# Patient Record
Sex: Female | Born: 1965 | Race: White | Hispanic: No | Marital: Married | Smoking: Former smoker
Health system: Southern US, Community
[De-identification: ages and names within clinical notes are randomized; demographics above are authoritative.]

## PROBLEM LIST (undated history)

## (undated) DIAGNOSIS — C4362 Malignant melanoma of left upper limb, including shoulder: Secondary | ICD-10-CM

## (undated) DIAGNOSIS — I499 Cardiac arrhythmia, unspecified: Secondary | ICD-10-CM

## (undated) DIAGNOSIS — F419 Anxiety disorder, unspecified: Secondary | ICD-10-CM

## (undated) DIAGNOSIS — K5790 Diverticulosis of intestine, part unspecified, without perforation or abscess without bleeding: Secondary | ICD-10-CM

## (undated) DIAGNOSIS — E785 Hyperlipidemia, unspecified: Secondary | ICD-10-CM

## (undated) DIAGNOSIS — R319 Hematuria, unspecified: Secondary | ICD-10-CM

## (undated) DIAGNOSIS — K635 Polyp of colon: Secondary | ICD-10-CM

## (undated) DIAGNOSIS — K219 Gastro-esophageal reflux disease without esophagitis: Secondary | ICD-10-CM

## (undated) HISTORY — DX: Cardiac arrhythmia, unspecified: I49.9

## (undated) HISTORY — DX: Polyp of colon: K63.5

## (undated) HISTORY — DX: Malignant melanoma of left upper limb, including shoulder: C43.62

## (undated) HISTORY — DX: Hematuria, unspecified: R31.9

## (undated) HISTORY — PX: OTHER SURGICAL HISTORY: SHX169

## (undated) HISTORY — DX: Diverticulosis of intestine, part unspecified, without perforation or abscess without bleeding: K57.90

## (undated) HISTORY — DX: Anxiety disorder, unspecified: F41.9

## (undated) HISTORY — DX: Gastro-esophageal reflux disease without esophagitis: K21.9

## (undated) HISTORY — DX: Hyperlipidemia, unspecified: E78.5

---

## 1997-08-24 ENCOUNTER — Inpatient Hospital Stay (HOSPITAL_COMMUNITY): Admission: AD | Admit: 1997-08-24 | Discharge: 1997-08-24 | Payer: Self-pay | Admitting: *Deleted

## 1997-10-03 ENCOUNTER — Inpatient Hospital Stay (HOSPITAL_COMMUNITY): Admission: AD | Admit: 1997-10-03 | Discharge: 1997-10-07 | Payer: Self-pay | Admitting: Obstetrics and Gynecology

## 1997-10-09 ENCOUNTER — Encounter (HOSPITAL_COMMUNITY): Admission: RE | Admit: 1997-10-09 | Discharge: 1997-11-30 | Payer: Self-pay | Admitting: Obstetrics and Gynecology

## 1998-12-18 ENCOUNTER — Other Ambulatory Visit: Admission: RE | Admit: 1998-12-18 | Discharge: 1998-12-18 | Payer: Self-pay | Admitting: *Deleted

## 1999-12-21 ENCOUNTER — Other Ambulatory Visit: Admission: RE | Admit: 1999-12-21 | Discharge: 1999-12-21 | Payer: Self-pay | Admitting: *Deleted

## 2001-01-26 ENCOUNTER — Other Ambulatory Visit: Admission: RE | Admit: 2001-01-26 | Discharge: 2001-01-26 | Payer: Self-pay | Admitting: Obstetrics and Gynecology

## 2002-02-23 ENCOUNTER — Other Ambulatory Visit: Admission: RE | Admit: 2002-02-23 | Discharge: 2002-02-23 | Payer: Self-pay | Admitting: *Deleted

## 2003-05-13 ENCOUNTER — Other Ambulatory Visit: Admission: RE | Admit: 2003-05-13 | Discharge: 2003-05-13 | Payer: Self-pay | Admitting: *Deleted

## 2004-05-18 ENCOUNTER — Other Ambulatory Visit: Admission: RE | Admit: 2004-05-18 | Discharge: 2004-05-18 | Payer: Self-pay | Admitting: Obstetrics and Gynecology

## 2004-07-06 ENCOUNTER — Ambulatory Visit: Payer: Self-pay | Admitting: Cardiology

## 2004-07-27 ENCOUNTER — Ambulatory Visit: Payer: Self-pay

## 2007-01-15 DIAGNOSIS — K635 Polyp of colon: Secondary | ICD-10-CM

## 2007-01-15 HISTORY — DX: Polyp of colon: K63.5

## 2007-12-21 ENCOUNTER — Emergency Department (HOSPITAL_COMMUNITY): Admission: EM | Admit: 2007-12-21 | Discharge: 2007-12-21 | Payer: Self-pay | Admitting: Emergency Medicine

## 2010-03-16 ENCOUNTER — Other Ambulatory Visit: Payer: Self-pay | Admitting: Family Medicine

## 2010-03-16 ENCOUNTER — Ambulatory Visit
Admission: RE | Admit: 2010-03-16 | Discharge: 2010-03-16 | Disposition: A | Discharge status: Home / Self Care | Payer: BC Managed Care – PPO | Source: Ambulatory Visit | Attending: Family Medicine | Admitting: Family Medicine

## 2010-03-16 MED ORDER — IOHEXOL 300 MG/ML  SOLN
100.0000 mL | Freq: Once | INTRAMUSCULAR | Status: AC | PRN
  Administered 2010-03-16: 100 mL via INTRAVENOUS

## 2010-10-19 LAB — POCT RAPID STREP A: Streptococcus, Group A Screen (Direct): NEGATIVE

## 2011-04-25 ENCOUNTER — Ambulatory Visit
Admission: RE | Admit: 2011-04-25 | Discharge: 2011-04-25 | Disposition: A | Discharge status: Home / Self Care | Payer: BC Managed Care – PPO | Source: Ambulatory Visit | Attending: Family Medicine | Admitting: Family Medicine

## 2011-04-25 ENCOUNTER — Other Ambulatory Visit: Payer: Self-pay | Admitting: Family Medicine

## 2011-04-25 MED ORDER — IOHEXOL 300 MG/ML  SOLN
100.0000 mL | Freq: Once | INTRAMUSCULAR | Status: AC | PRN
  Administered 2011-04-25: 100 mL via INTRAVENOUS

## 2011-04-25 MED ORDER — IOHEXOL 300 MG/ML  SOLN
30.0000 mL | INTRAMUSCULAR | Status: AC
  Administered 2011-04-25: 30 mL via ORAL

## 2011-06-03 ENCOUNTER — Other Ambulatory Visit (INDEPENDENT_AMBULATORY_CARE_PROVIDER_SITE_OTHER): Payer: Self-pay | Admitting: Surgery

## 2011-06-03 ENCOUNTER — Encounter (INDEPENDENT_AMBULATORY_CARE_PROVIDER_SITE_OTHER): Payer: Self-pay | Admitting: Surgery

## 2011-06-03 ENCOUNTER — Ambulatory Visit (INDEPENDENT_AMBULATORY_CARE_PROVIDER_SITE_OTHER): Payer: BC Managed Care – PPO | Admitting: Surgery

## 2011-06-03 VITALS — BP 122/84 | HR 80 | Temp 98.3°F | Resp 14 | Ht 66.0 in | Wt 123.0 lb

## 2011-06-03 DIAGNOSIS — K635 Polyp of colon: Secondary | ICD-10-CM | POA: Insufficient documentation

## 2011-06-03 DIAGNOSIS — R319 Hematuria, unspecified: Secondary | ICD-10-CM | POA: Insufficient documentation

## 2011-06-03 DIAGNOSIS — K5732 Diverticulitis of large intestine without perforation or abscess without bleeding: Secondary | ICD-10-CM

## 2011-06-03 DIAGNOSIS — D126 Benign neoplasm of colon, unspecified: Secondary | ICD-10-CM

## 2011-06-03 DIAGNOSIS — K5792 Diverticulitis of intestine, part unspecified, without perforation or abscess without bleeding: Secondary | ICD-10-CM

## 2011-06-03 NOTE — Patient Instructions (Signed)
Diverticulitis A diverticulum is a small pouch or sac on the colon. Diverticulosis is the presence of these diverticula on the colon. Diverticulitis is the irritation (inflammation) or infection of diverticula. CAUSES  The colon and its diverticula contain bacteria. If food particles block the tiny opening to a diverticulum, the bacteria inside can grow and cause an increase in pressure. This leads to infection and inflammation and is called diverticulitis. SYMPTOMS   Abdominal pain and tenderness. Usually, the pain is located on the left side of your abdomen. However, it could be located elsewhere.   Fever.   Bloating.   Feeling sick to your stomach (nausea).   Throwing up (vomiting).   Abnormal stools.  DIAGNOSIS  Your caregiver will take a history and perform a physical exam. Since many things can cause abdominal pain, other tests may be necessary. Tests may include:  Blood tests.   Urine tests.   X-ray of the abdomen.   CT scan of the abdomen.  Sometimes, surgery is needed to determine if diverticulitis or other conditions are causing your symptoms. TREATMENT  Most of the time, you can be treated without surgery. Treatment includes:  Resting the bowels by only having liquids for a few days. As you improve, you will need to eat a low-fiber diet.   Intravenous (IV) fluids if you are losing body fluids (dehydrated).   Antibiotic medicines that treat infections may be given.   Pain and nausea medicine, if needed.   Surgery if the inflamed diverticulum has burst.  HOME CARE INSTRUCTIONS   Try a clear liquid diet (broth, tea, or water for as long as directed by your caregiver). You may then gradually begin a low-fiber diet as tolerated. A low-fiber diet is a diet with less than 10 grams of fiber. Choose the foods below to reduce fiber in the diet:   White breads, cereals, rice, and pasta.   Cooked fruits and vegetables or soft fresh fruits and vegetables without the skin.     Ground or well-cooked tender beef, ham, veal, lamb, pork, or poultry.   Eggs and seafood.   After your diverticulitis symptoms have improved, your caregiver may put you on a high-fiber diet. A high-fiber diet includes 14 grams of fiber for every 1000 calories consumed. For a standard 2000 calorie diet, you would need 28 grams of fiber. Follow these diet guidelines to help you increase the fiber in your diet. It is important to slowly increase the amount fiber in your diet to avoid gas, constipation, and bloating.   Choose whole-grain breads, cereals, pasta, and brown rice.   Choose fresh fruits and vegetables with the skin on. Do not overcook vegetables because the more vegetables are cooked, the more fiber is lost.   Choose more nuts, seeds, legumes, dried peas, beans, and lentils.   Look for food products that have greater than 3 grams of fiber per serving on the Nutrition Facts label.   Take all medicine as directed by your caregiver.   If your caregiver has given you a follow-up appointment, it is very important that you go. Not going could result in lasting (chronic) or permanent injury, pain, and disability. If there is any problem keeping the appointment, call to reschedule.  SEEK MEDICAL CARE IF:   Your pain does not improve.   You have a hard time advancing your diet beyond clear liquids.   Your bowel movements do not return to normal.  SEEK IMMEDIATE MEDICAL CARE IF:   Your pain becomes   worse.   You have an oral temperature above 102 F (38.9 C), not controlled by medicine.   You have repeated vomiting.   You have bloody or black, tarry stools.   Symptoms that brought you to your caregiver become worse or are not getting better.  MAKE SURE YOU:   Understand these instructions.   Will watch your condition.   Will get help right away if you are not doing well or get worse.  Document Released: 10/10/2004 Document Revised: 12/20/2010 Document Reviewed:  02/05/2010 Exit[employer] Patient Information 2012 [employer]. 

## 2011-06-03 NOTE — Progress Notes (Signed)
Subjective:     Patient ID: Cheryl Fisher, female   DOB: 07-19-1965, 46 y.o.   MRN: [id number]  HPI  Cheryl Fisher  Oct 13, 1965 [id number]  Patient Care Team: Sigmund Hazel, MD as PCP - General (Family Medicine)  This patient is a 46 y.o.female who presents today for surgical evaluation at the request of Dr. Hyacinth Meeker.   Reason for visit: Recurrent diverticulitis. Consideration of colectomy.  Patient is a pleasant active female. For the past 4 years and she's had at least 2 attacks a year. CT has documented diverticulitis of the descending colon and sigmoid colon. She describes them as crampy bloating pain. A field block up. Usually in the left lower quadrant and left side. We'll get some fecal urgency. No real relief. Milder attacks usually been helped by Aleve. She had a more 10 contents attack. Augmentin did not help. Eventually Cipro and Flagyl did.  She has a history of polyps in 2009 by endoscopy by Dr. Danise Edge. 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  She notes her stools have been more ribbonlike and more narrow in caliber. This concerned her.  No major bleeding.  Ago she's had at least 8 attacks in the most recent was the most severe, she was sent to me for consideration of surgery  Patient Active Problem List  Diagnoses  . Diverticulitis, recurrent  . Colon polyp, hamartomatous polyp     Past Medical History  Diagnosis Date  . Hyperlipidemia   . 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 Malignant melanoma of skin of forearm, left     excised.  Neg SLN  . Hematuria - cause not known     History reviewed. No pertinent past surgical history.  History   Social History  . Marital Status: Married    Spouse Name: N/A    Number of Children: N/A  . Years of Education: N/A   Occupational History  . Not on file.   Social History Main Topics  . Smoking status: Former Smoker    Quit date: 01/14/1990  . Smokeless tobacco: Not on file    . Alcohol Use: Yes     2 per week  . Drug Use: No  . Sexually Active:    Other Topics Concern  . Not on file   Social History Narrative  . No narrative on file    History reviewed. No pertinent family history.  Current Outpatient Prescriptions  Medication Sig Dispense Refill  . ALPRAZolam (XANAX) 0.5 MG tablet       . diphenhydrAMINE (SOMINEX) 25 MG tablet Take 25 mg by mouth at bedtime as needed.      Marland Kitchen ERRIN 0.35 MG tablet       . OMEGA 3 1200 MG CAPS Take by mouth.      . Probiotic Product (PROBIOTIC PO) Take by mouth.         Allergies  Allergen Reactions  . Neomycin     Contact Dermititis  . Sulfa Antibiotics Hives  . Tape     Contact Dermititis    BP 122/84  Pulse 80  Temp(Src) 98.3 F (36.8 C) (Temporal)  Resp 14  Ht 5\' 6"  (1.676 m)  Wt 123 lb (55.792 kg)  BMI 19.85 kg/m2  No results found.   Review of Systems  Constitutional: Negative for fever, chills, diaphoresis, appetite change and fatigue.  HENT: Negative for ear pain, sore throat, trouble swallowing, neck pain and ear discharge.  Eyes: Negative for photophobia, discharge and visual disturbance.  Respiratory: Negative for cough, choking, chest tightness and shortness of breath.   Cardiovascular: Negative for chest pain.       Patient walks for about 5 miles without difficulty.  No exertional chest/neck/shoulder/arm pain.   Gastrointestinal: Negative for nausea, vomiting, constipation, blood in stool, anal bleeding and rectal pain.  Genitourinary: Negative for dysuria, frequency and difficulty urinating.  Musculoskeletal: Negative for myalgias and gait problem.  Skin: Negative for color change, pallor and rash.  Neurological: Negative for dizziness, speech difficulty, weakness and numbness.  Hematological: Negative for adenopathy.  Psychiatric/Behavioral: Negative for confusion and agitation. The patient is not nervous/anxious.        Objective:   Physical Exam  Constitutional: She is  oriented to person, place, and time. She appears well-developed and well-nourished. No distress.  HENT:  Head: Normocephalic.  Mouth/Throat: Oropharynx is clear and moist. No oropharyngeal exudate.  Eyes: Conjunctivae and EOM are normal. Pupils are equal, round, and reactive to light. No scleral icterus.  Neck: Normal range of motion. Neck supple. No tracheal deviation present.  Cardiovascular: Normal rate, regular rhythm and intact distal pulses.   Pulmonary/Chest: Effort normal and breath sounds normal. No respiratory distress. She exhibits no tenderness.  Abdominal: Soft. She exhibits no distension and no mass. There is no tenderness. Hernia confirmed negative in the right inguinal area and confirmed negative in the left inguinal area.  Genitourinary: No vaginal discharge found.  Musculoskeletal: Normal range of motion. She exhibits no tenderness.  Lymphadenopathy:    She has no cervical adenopathy.       Right: No inguinal adenopathy present.       Left: No inguinal adenopathy present.  Neurological: She is alert and oriented to person, place, and time. No cranial nerve deficit. She exhibits normal muscle tone. Coordination normal.  Skin: Skin is warm and dry. No rash noted. She is not diaphoretic. No erythema.  Psychiatric: She has a normal mood and affect. Her behavior is normal. Judgment and thought content normal.       Assessment:     Recurrent diverticulitis descending & sigmoid    Plan:     Given her young age and frequent attacks, I think she would benefit from resection. She probably will require more extended left hemicolectomy given the descending colon involved as well.   She is a good laparoscopic candidate  Change in caliber or some stools warrants a preop endoscopy. She would like a different gastroenterologist if possible since she feels she did not hit it off as well as the prior one.  She has had trouble trying to establish with a new one. We will see if we can  facilitate this.  The anatomy & physiology of the digestive tract was discussed.  The pathophysiology was discussed.  Natural history risks without surgery was discussed.   I feel the risks of no intervention will lead to serious problems that outweigh the operative risks; therefore, I recommended a partial colectomy to remove the pathology.  Laparoscopic & open techniques were discussed.   Risks such as bleeding, infection, abscess, leak, reoperation, possible ostomy, hernia, heart attack, death, and other risks were discussed.  I noted a good likelihood this will help address the problem.   Goals of post-operative recovery were discussed as well.  We will work to minimize complications.  An educational handout on the pathology was given as well.  Questions were answered.  The patient expresses understanding & wishes to  proceed with surgery.

## 2011-06-06 ENCOUNTER — Encounter: Payer: Self-pay | Admitting: Gastroenterology

## 2011-07-05 ENCOUNTER — Encounter: Payer: Self-pay | Admitting: Gastroenterology

## 2011-07-05 ENCOUNTER — Ambulatory Visit (INDEPENDENT_AMBULATORY_CARE_PROVIDER_SITE_OTHER): Payer: BC Managed Care – PPO | Admitting: Gastroenterology

## 2011-07-05 VITALS — BP 106/70 | HR 80 | Ht 66.0 in | Wt 124.8 lb

## 2011-07-05 DIAGNOSIS — K5732 Diverticulitis of large intestine without perforation or abscess without bleeding: Secondary | ICD-10-CM

## 2011-07-05 DIAGNOSIS — R933 Abnormal findings on diagnostic imaging of other parts of digestive tract: Secondary | ICD-10-CM

## 2011-07-05 DIAGNOSIS — R198 Other specified symptoms and signs involving the digestive system and abdomen: Secondary | ICD-10-CM

## 2011-07-05 MED ORDER — MOVIPREP 100 G PO SOLR: 1.0000 | Freq: Once | ORAL | Status: DC

## 2011-07-05 NOTE — Patient Instructions (Addendum)
You have been scheduled for a colonoscopy with propofol. Please follow written instructions given to you at your visit today.  Please pick up your prep kit at the pharmacy within the next 1-3 days. cc: Sigmund Hazel, MD       Karie Soda, MD

## 2011-07-05 NOTE — Progress Notes (Signed)
History of Present Illness: This is a 46 year old female with a history of recurrent diverticulitis. She states she has had 8-10 episodes of diverticulitis since 2009. Her most recent episode was more severe. CT scan performed in April 2013 showed diverticulitis in the sigmoid colon and descending colon. She states she was treated with 3 courses of antibiotics for her most recent episode of diverticulitis. She is noted a change in bowel habits with longer, thinner stools since her last bout of diverticulitis. She also lost weight with her episode of diverticulitis. She previously underwent colonoscopy by Dr. Reece Agar in 2009 with findings of diverticulosis and a hamartomatous colon polyp by report. I do not have the procedure report available to review today. She relates problems with nausea and vomiting following her last colonoscopy. Denies constipation, diarrhea, change in stool caliber, melena, hematochezia, nausea, vomiting, dysphagia, reflux symptoms, chest pain.  Review of Systems: Pertinent positive and negative review of systems were noted in the above HPI section. All other review of systems were otherwise negative.  Current Medications, Allergies, Past Medical History, Past Surgical History, Family History and Social History were reviewed in [employer] record.  Physical Exam: General: Well developed , well nourished, no acute distress Head: Normocephalic and atraumatic Eyes:  sclerae anicteric, EOMI Ears: Normal auditory acuity Mouth: No deformity or lesions Neck: Supple, no masses or thyromegaly Lungs: Clear throughout to auscultation Heart: Regular rate and rhythm; no murmurs, rubs or bruits Abdomen: Soft, non tender and non distended. No masses, hepatosplenomegaly or hernias noted. Normal Bowel sounds Rectal: deferred to colonoscopy Musculoskeletal: Symmetrical with no gross deformities  Skin: No lesions on visible extremities Pulses:  Normal pulses  noted Extremities: No clubbing, cyanosis, edema or deformities noted Neurological: Alert oriented x 4, grossly nonfocal Cervical Nodes:  No significant cervical adenopathy Inguinal Nodes: No significant inguinal adenopathy Psychological:  Alert and cooperative. Normal mood and affect  Assessment and Recommendations:  1. Recurrent diverticulitis. Change in bowel habits. Rule out a stricture, neoplasm, inflammatory bowel disease and other disorders. Schedule colonoscopy. The risks, benefits, and alternatives to colonoscopy with possible biopsy and possible polypectomy were discussed with the patient and they consent to proceed. She relates nausea and vomiting following her last colonoscopy we will consider peri-procedure antiemetics. Await records from prior colonoscopy. Long-term high fiber diet with adequate daily water intake.

## 2011-07-12 ENCOUNTER — Ambulatory Visit (AMBULATORY_SURGERY_CENTER): Payer: BC Managed Care – PPO | Admitting: Gastroenterology

## 2011-07-12 ENCOUNTER — Encounter: Payer: Self-pay | Admitting: Gastroenterology

## 2011-07-12 VITALS — BP 132/75 | HR 72 | Temp 98.9°F | Resp 16 | Ht 66.0 in | Wt 124.0 lb

## 2011-07-12 DIAGNOSIS — R933 Abnormal findings on diagnostic imaging of other parts of digestive tract: Secondary | ICD-10-CM

## 2011-07-12 DIAGNOSIS — K5732 Diverticulitis of large intestine without perforation or abscess without bleeding: Secondary | ICD-10-CM

## 2011-07-12 MED ORDER — SODIUM CHLORIDE 0.9 % IV SOLN: 500.0000 mL | INTRAVENOUS | Status: DC

## 2011-07-12 NOTE — Progress Notes (Signed)
Patient did not experience any of the following events: a burn prior to discharge; a fall within the facility; wrong site/side/patient/procedure/implant event; or a hospital transfer or hospital admission upon discharge from the facility. (G8907) Patient did not have preoperative order for IV antibiotic SSI prophylaxis. (G8918)  

## 2011-07-12 NOTE — Op Note (Signed)
[facility] [address] [place], [place]  [address]  COLONOSCOPY PROCEDURE REPORT  PATIENT:  Cheryl Fisher, Cheryl Fisher  MR#:  [id number] BIRTHDATE:  [birth year], 45 yrs. old  GENDER:  female ENDOSCOPIST:  Judie Petit T. Russella Dar, MD, [facility] Referred by:  Sigmund Hazel, M.D. PROCEDURE DATE:  07/12/2011 PROCEDURE:  Colonoscopy 13086 ASA CLASS:  Class II INDICATIONS:  1) Abnormal CT of abdomen  2) diverticulitis MEDICATIONS:   MAC sedation, administered by CRNA, propofol (Diprivan) 370 mg IV DESCRIPTION OF PROCEDURE:   After the risks benefits and alternatives of the procedure were thoroughly explained, informed consent was obtained.  Digital rectal exam was performed and revealed no abnormalities.   The LB [id number] endoscope was introduced through the anus and advanced to the cecum, which was identified by both the appendix and ileocecal valve, without limitations.  The quality of the prep was excellent, using MoviPrep.  The instrument was then slowly withdrawn as the colon was fully examined. <<PROCEDUREIMAGES>> FINDINGS:  Moderate diverticulosis was found in the sigmoid to descending colon.  Otherwise normal colonoscopy without other polyps, masses, vascular ectasias, or inflammatory changes. Retroflexed views in the rectum revealed no abnormalities.  The time to cecum =  3.75  minutes. The scope was then withdrawn (time =  10.25  min) from the patient and the procedure completed.  COMPLICATIONS:  None  ENDOSCOPIC IMPRESSION: 1) Moderate diverticulosis in the sigmoid to descending colon  RECOMMENDATIONS: 1) High fiber diet with liberal fluid intake. 2) Continue current colorectal screening for "routine risk" patients with a repeat colonoscopy in 10 years.  Venita Lick. Russella Dar, MD, [facility]  n. eSIGNED:   Venita Lick. Keshawn Fiorito at 07/12/2011 10:07 AM  Rebeca Allegra, [phone]

## 2011-07-12 NOTE — Patient Instructions (Addendum)
Discharge instructions given with verbal understanding. Handouts on diverticulosis and a high fiber diet. Resume previous medications. YOU HAD AN ENDOSCOPIC PROCEDURE TODAY AT THE [facility]: Refer to the procedure report that was given to you for any specific questions about what was found during the examination.  If the procedure report does not answer your questions, please call your gastroenterologist to clarify.  If you requested that your care partner not be given the details of your procedure findings, then the procedure report has been included in a sealed envelope for you to review at your convenience later.  YOU SHOULD EXPECT: Some feelings of bloating in the abdomen. Passage of more gas than usual.  Walking can help get rid of the air that was put into your GI tract during the procedure and reduce the bloating. If you had a lower endoscopy (such as a colonoscopy or flexible sigmoidoscopy) you may notice spotting of blood in your stool or on the toilet paper. If you underwent a bowel prep for your procedure, then you may not have a normal bowel movement for a few days.  DIET: Your first meal following the procedure should be a light meal and then it is ok to progress to your normal diet.  A half-sandwich or bowl of soup is an example of a good first meal.  Heavy or fried foods are harder to digest and may make you feel nauseous or bloated.  Likewise meals heavy in dairy and vegetables can cause extra gas to form and this can also increase the bloating.  Drink plenty of fluids but you should avoid alcoholic beverages for 24 hours.  ACTIVITY: Your care partner should take you home directly after the procedure.  You should plan to take it easy, moving slowly for the rest of the day.  You can resume normal activity the day after the procedure however you should NOT DRIVE or use heavy machinery for 24 hours (because of the sedation medicines used during the test).    SYMPTOMS TO REPORT  IMMEDIATELY: A gastroenterologist can be reached at any hour.  During normal business hours, 8:30 AM to 5:00 PM Monday through Friday, call [phone].  After hours and on weekends, please call the GI answering service at [phone] who will take a message and have the physician on call contact you.   Following lower endoscopy (colonoscopy or flexible sigmoidoscopy):  Excessive amounts of blood in the stool  Significant tenderness or worsening of abdominal pains  Swelling of the abdomen that is new, acute  Fever of 100F or higher FOLLOW UP: If any biopsies were taken you will be contacted by phone or by letter within the next 1-3 weeks.  Call your gastroenterologist if you have not heard about the biopsies in 3 weeks.  Our staff will call the home number listed on your records the next business day following your procedure to check on you and address any questions or concerns that you may have at that time regarding the information given to you following your procedure. This is a courtesy call and so if there is no answer at the home number and we have not heard from you through the emergency physician on call, we will assume that you have returned to your regular daily activities without incident.  SIGNATURES/CONFIDENTIALITY: You and/or your care partner have signed paperwork which will be entered into your electronic medical record.  These signatures attest to the fact that that the information above on your After   Visit Summary has been reviewed and is understood.  Full responsibility of the confidentiality of this discharge information lies with you and/or your care-partner. 

## 2011-07-15 ENCOUNTER — Telehealth: Payer: Self-pay

## 2011-07-15 NOTE — Telephone Encounter (Signed)
  Follow up Call-  Call back number 07/12/2011  Post procedure Call Back phone  # [phone]  Permission to leave phone message Yes     Patient questions:  Do you have a fever, pain , or abdominal swelling? no Pain Score  0 *  Have you tolerated food without any problems? yes  Have you been able to return to your normal activities? yes  Do you have any questions about your discharge instructions: Diet   no Medications  no Follow up visit  no  Do you have questions or concerns about your Care? no  Actions: * If pain score is 4 or above: No action needed, pain <4.

## 2011-08-20 ENCOUNTER — Ambulatory Visit: Payer: BC Managed Care – PPO | Admitting: Gastroenterology

## 2011-08-23 ENCOUNTER — Ambulatory Visit (HOSPITAL_COMMUNITY): Admission: RE | Admit: 2011-08-23 | Payer: BC Managed Care – PPO | Source: Ambulatory Visit | Admitting: Surgery

## 2011-08-23 ENCOUNTER — Encounter (HOSPITAL_COMMUNITY): Admission: RE | Payer: Self-pay | Source: Ambulatory Visit

## 2011-08-23 SURGERY — LAPAROSCOPIC PARTIAL COLECTOMY
Anesthesia: General

## 2011-09-09 ENCOUNTER — Encounter: Payer: Self-pay | Admitting: Gastroenterology

## 2011-09-09 ENCOUNTER — Ambulatory Visit (INDEPENDENT_AMBULATORY_CARE_PROVIDER_SITE_OTHER): Payer: BC Managed Care – PPO | Admitting: Gastroenterology

## 2011-09-09 VITALS — BP 108/70 | HR 60 | Ht 66.0 in | Wt 124.8 lb

## 2011-09-09 DIAGNOSIS — K5730 Diverticulosis of large intestine without perforation or abscess without bleeding: Secondary | ICD-10-CM

## 2011-09-09 NOTE — Patient Instructions (Addendum)
Continue your High Fiber diet.  High Fiber Diet A high fiber diet changes your normal diet to include more whole grains, legumes, fruits, and vegetables. Changes in the diet involve replacing refined carbohydrates with unrefined foods. The calorie level of the diet is essentially unchanged. The Dietary Reference Intake (recommended amount) for adult males is 38 g per day. For adult females, it is 25 g per day. Pregnant and lactating women should consume 28 g of fiber per day. Fiber is the intact part of a plant that is not broken down during digestion. Functional fiber is fiber that has been isolated from the plant to provide a beneficial effect in the body. PURPOSE  Increase stool bulk.   Ease and regulate bowel movements.   Lower cholesterol.  INDICATIONS THAT YOU NEED MORE FIBER  Constipation and hemorrhoids.   Uncomplicated diverticulosis (intestine condition) and irritable bowel syndrome.   Weight management.   As a protective measure against hardening of the arteries (atherosclerosis), diabetes, and cancer.  NOTE OF CAUTION If you have a digestive or bowel problem, ask your caregiver for advice before adding high fiber foods to your diet. Some of the following medical problems are such that a high fiber diet should not be used without consulting your caregiver:  Acute diverticulitis (intestine infection).   Partial small bowel obstructions.   Complicated diverticular disease involving bleeding, rupture (perforation), or abscess (boil, furuncle).   Presence of autonomic neuropathy (nerve damage) or gastric paresis (stomach cannot empty itself).  GUIDELINES FOR INCREASING FIBER  Start adding fiber to the diet slowly. A gradual increase of about 5 more grams (2 slices of whole-wheat bread, 2 servings of most fruits or vegetables, or 1 bowl of high fiber cereal) per day is best. Too rapid an increase in fiber may result in constipation, flatulence, and bloating.   Drink enough  water and fluids to keep your urine clear or pale yellow. Water, juice, or caffeine-free drinks are recommended. Not drinking enough fluid may cause constipation.   Eat a variety of high fiber foods rather than one type of fiber.   Try to increase your intake of fiber through using high fiber foods rather than fiber pills or supplements that contain small amounts of fiber.   The goal is to change the types of food eaten. Do not supplement your present diet with high fiber foods, but replace foods in your present diet.  INCLUDE A VARIETY OF FIBER SOURCES  Replace refined and processed grains with whole grains, canned fruits with fresh fruits, and incorporate other fiber sources. White rice, white breads, and most bakery goods contain little or no fiber.   Brown whole-grain rice, buckwheat oats, and many fruits and vegetables are all good sources of fiber. These include: broccoli, Brussels sprouts, cabbage, cauliflower, beets, sweet potatoes, white potatoes (skin on), carrots, tomatoes, eggplant, squash, berries, fresh fruits, and dried fruits.   Cereals appear to be the richest source of fiber. Cereal fiber is found in whole grains and bran. Bran is the fiber-rich outer coat of cereal grain, which is largely removed in refining. In whole-grain cereals, the bran remains. In breakfast cereals, the largest amount of fiber is found in those with "bran" in their names. The fiber content is sometimes indicated on the label.   You may need to include additional fruits and vegetables each day.   In baking, for 1 cup white flour, you may use the following substitutions:   1 cup whole-wheat flour minus 2 tbs.  cup white flour plus  cup whole-wheat flour.  Document Released: 12/31/2004 Document Revised: 12/20/2010 Document Reviewed: 11/08/2008 [facility] Patient Information 2012 [place], [place].  cc: Sigmund Hazel, MD

## 2011-09-09 NOTE — Progress Notes (Signed)
History of Present Illness: This is a 46 year old female returning for followup of diverticulosis with a history of recurrent diverticulitis. Recent colonoscopy showed sigmoid and descending colon diverticulosis. He is felt well since her colonoscopy. She is following a high fiber diet and avoiding nuts and popcorn. She is trying to decide whether to proceed with surgery or see if she has recurrent diverticulitis.  Current Medications, Allergies, Past Medical History, Past Surgical History, Family History and Social History were reviewed in [employer] record.  Physical Exam: General: Well developed , well nourished, no acute distress Head: Normocephalic and atraumatic Eyes:  sclerae anicteric, EOMI Ears: Normal auditory acuity Mouth: No deformity or lesions Lungs: Clear throughout to auscultation Heart: Regular rate and rhythm; no murmurs, rubs or bruits Abdomen: Soft, non tender and non distended. No masses, hepatosplenomegaly or hernias noted. Normal Bowel sounds Musculoskeletal: Symmetrical with no gross deformities  Extremities: No clubbing, cyanosis, edema or deformities noted Neurological: Alert oriented x 4, grossly nonfocal Psychological:  Alert and cooperative. Normal mood and affect  Assessment and Recommendations:  1.  Sigmoid and descending colon diverticulosis with recurrent diverticulitis. Long-term high fiber diet with adequate daily water intake. She may benefit from avoiding popcorn and nuts and minimizing seeds however this is not necessarily beneficial. I advised her that a decision on an elective surgical resection is her choice since she has not had complicated diverticulitis or hospitalizations. Retreatment with antibiotics or proceeding with an elective resection are both reasonable options and the decision is hers to consider. She is advised to call for further evaluation if she has recurrent episodes of abdominal pain.

## 2011-12-02 ENCOUNTER — Ambulatory Visit (INDEPENDENT_AMBULATORY_CARE_PROVIDER_SITE_OTHER): Payer: BC Managed Care – PPO | Admitting: Physician Assistant

## 2011-12-02 ENCOUNTER — Telehealth: Payer: Self-pay | Admitting: Gastroenterology

## 2011-12-02 ENCOUNTER — Encounter: Payer: Self-pay | Admitting: Physician Assistant

## 2011-12-02 VITALS — BP 118/70 | HR 60 | Ht 65.5 in | Wt 123.6 lb

## 2011-12-02 DIAGNOSIS — K5792 Diverticulitis of intestine, part unspecified, without perforation or abscess without bleeding: Secondary | ICD-10-CM

## 2011-12-02 DIAGNOSIS — K5732 Diverticulitis of large intestine without perforation or abscess without bleeding: Secondary | ICD-10-CM

## 2011-12-02 MED ORDER — METRONIDAZOLE 500 MG PO TABS: 500.0000 mg | ORAL_TABLET | Freq: Two times a day (BID) | ORAL | Status: AC

## 2011-12-02 MED ORDER — CIPROFLOXACIN HCL 500 MG PO TABS: 500.0000 mg | ORAL_TABLET | Freq: Two times a day (BID) | ORAL | Status: AC

## 2011-12-02 MED ORDER — GLYCOPYRROLATE 2 MG PO TABS: ORAL_TABLET | ORAL | Status: DC

## 2011-12-02 NOTE — Progress Notes (Signed)
Subjective:    Patient ID: Cheryl Fisher, female    DOB: 12-14-1965, 46 y.o.   MRN: [id number]  HPI [name] is a pleasant 46 year old white female known to Dr. Russella Dar with history of diverticular disease, she also has history of a hamartomatous colon polyp. She last had colonoscopy in June of 2013 and was noted to have moderate diverticulosis in the descending and sigmoid colon, no polyps were seen . She comes in today as an urgent add-on with complaints of left lower cautery pain since Thursday, 11/28/2011. She says she's been having some discomfort in her left lateral abdomen lower ribs over the past couple of months and has been annoying. She saw her primary doctor who did labs etc. and was told that everything was fine. She started on Thursday last week with pain in the more lower part of her left abdomen which feels like her prior episodes of diverticulitis. His pain has been constant and associated with abdominal cramping and some diarrhea area she has not had any bleeding, no fever chills nausea or vomiting. She says she feels puny but is at work today. She put herself on liquids over the weekend, then tried to eat solid food last night and had more discomfort. No current urinary symptoms. Her last episode of diverticulitis was in the spring of 2013 and treated with 2 courses of antibiotics, responded to Cipro and Flagyl. She says she's had about 8 episodes of diverticulitis altogether generally 1 or 2 every year. She has discussed to surgery with Dr. Michaell Cowing but is very hesitant to proceed.     Review of Systems  Constitutional: Positive for fatigue.  HENT: Negative.   Eyes: Negative.   Respiratory: Negative.   Cardiovascular: Negative.   Gastrointestinal: Positive for abdominal pain and diarrhea.  Genitourinary: Negative.   Musculoskeletal: Negative.   Neurological: Negative.   Hematological: Negative.   Psychiatric/Behavioral: Negative.    Outpatient Prescriptions Prior to Visit    Medication Sig Dispense Refill  . ALPRAZolam (XANAX) 0.5 MG tablet Take 0.5 mg by mouth as needed.       . diphenhydrAMINE (SOMINEX) 25 MG tablet Take 25 mg by mouth at bedtime as needed.      Marland Kitchen ERRIN 0.35 MG tablet       . OMEGA 3 1200 MG CAPS Take by mouth.      . Probiotic Product (PROBIOTIC PO) Take by mouth.           Allergies  Allergen Reactions  . Neomycin     Contact Dermititis  . Sulfa Antibiotics Hives  . Tape     Contact Dermititis   Patient Active Problem List  Diagnosis  . Diverticulitis, recurrent  . Colon polyp, hamartomatous polyp   . Hematuria - cause not known   History  Substance Use Topics  . Smoking status: Former Smoker    Quit date: 01/14/1990  . Smokeless tobacco: Never Used  . Alcohol Use: Yes     Comment: 2 per week   Objective:   Physical Exam healthy-appearing white female in no acute distress, pleasant blood pressure 118/70 pulse 60 height 5 foot 5 weight 123. HEENT; nontraumatic normocephalic EOMI PERRLA sclera anicteric, Neck ;supple no JVD, Cardiovascular; regular rate and rhythm with S1-S2 no murmur or gallop, Pulmonary; clear bilaterally, Abdomen; soft bowel sounds are present she is tender in the left mid quadrant and left lower quadrant with some guarding in the left lower quadrant is no rebound, no palpable mass or hepatosplenomegaly, Rectal;  exam not done, Extremities; no clubbing cyanosis or edema skin warm and dry, Psych; mood and affect normal and appropriate        Assessment & Plan:  #25 46 year old female with recurrent sigmoid diverticulitis. Patient has had several episodes over the past 4 years but has not required hospitalization. #2 history of hamartomatous colon polyp  Plan; she will gradually advance her diet as she tolerates Start Cipro 500 mg by mouth twice daily x14 days Start Flagyl 500 mg by mouth twice daily x14 days Continue probiotic daily Add trial of Robinul Forte 2 mg twice daily as needed for cramping and  spasm. Patient is advised to call if her symptoms have not completely resolved when she completes her antibiotics and/or if her symptoms worsen at any point in the interim.  #2

## 2011-12-02 NOTE — Telephone Encounter (Signed)
Patient reports that she has had LLQ pain since Thursday.  She put herself on a liquid diet and a heating pad on Thursday with some improvement on Fri.  She felt some better Friday and Saturday, yesterday she returned to a regular diet and this am is having pain in the LLQ and cramping with diarrhea.  She has a history of diverticulitis and has discussed possible colon resection with Dr. Michaell Cowing, but has not made the decision to go through with it.  She will come in and see Mike Gip PA today at 10:30

## 2011-12-02 NOTE — Progress Notes (Signed)
Reviewed and agree with management plans.  Amzie Sillas T. Derrious Bologna MD FACG  

## 2011-12-02 NOTE — Patient Instructions (Addendum)
We sent prescriptions to [facility], [address], for Cipro, Flagyl  ( Metronidazole) and Robinul Forte ( Glycopyrolate).   Call us if your symptoms haven't resolved once completing the antibiotics.

## 2011-12-20 ENCOUNTER — Inpatient Hospital Stay: Admit: 2011-12-20 | Payer: Self-pay | Admitting: Surgery

## 2011-12-20 SURGERY — LAPAROSCOPIC PARTIAL COLECTOMY
Anesthesia: General

## 2012-09-30 ENCOUNTER — Ambulatory Visit (INDEPENDENT_AMBULATORY_CARE_PROVIDER_SITE_OTHER): Payer: BC Managed Care – PPO | Admitting: Gastroenterology

## 2012-09-30 ENCOUNTER — Telehealth: Payer: Self-pay | Admitting: Gastroenterology

## 2012-09-30 ENCOUNTER — Encounter: Payer: Self-pay | Admitting: Gastroenterology

## 2012-09-30 VITALS — BP 112/74 | HR 68 | Temp 97.9°F | Ht 65.5 in | Wt 128.0 lb

## 2012-09-30 DIAGNOSIS — R1032 Left lower quadrant pain: Secondary | ICD-10-CM | POA: Insufficient documentation

## 2012-09-30 DIAGNOSIS — K5792 Diverticulitis of intestine, part unspecified, without perforation or abscess without bleeding: Secondary | ICD-10-CM

## 2012-09-30 DIAGNOSIS — K5732 Diverticulitis of large intestine without perforation or abscess without bleeding: Secondary | ICD-10-CM | POA: Insufficient documentation

## 2012-09-30 MED ORDER — CIPROFLOXACIN HCL 500 MG PO TABS: 500.0000 mg | ORAL_TABLET | Freq: Two times a day (BID) | ORAL | Status: AC

## 2012-09-30 MED ORDER — CIPROFLOXACIN HCL 500 MG PO TABS: 500.0000 mg | ORAL_TABLET | Freq: Two times a day (BID) | ORAL | Status: DC

## 2012-09-30 MED ORDER — METRONIDAZOLE 500 MG PO TABS: ORAL_TABLET | ORAL | Status: DC

## 2012-09-30 NOTE — Patient Instructions (Addendum)
We sent  prescriptons to [facility] for Flagyl ( Metronidazole ) and Cipro. Once you are done with the antibiotics, you can take either Miralax or Metamucil once daily.  Call us back after you take the medications/treatment if you are not significantly better.

## 2012-09-30 NOTE — Telephone Encounter (Signed)
Patient with a history of diverticulitis.  She c/o LLQ pain , cramping, constipation, and chills.  She will come in today at 3:30 today to see Doug Sou, PA

## 2012-09-30 NOTE — Progress Notes (Signed)
09/30/2012 Cheryl Fisher [id number] 1965-08-21   History of Present Illness:  Cheryl Fisher is a pleasant 47 year old white female known to Dr. Russella Dar with history of diverticular disease.  She also has history of a hamartomatous colon polyp. She last had colonoscopy in June of 2013 and was noted to have moderate diverticulosis in the descending and sigmoid colon, no polyps were seen during this procedure. She comes in today as an urgent add-on with complaints of left lower quadrant pain since Thursday (6 days ago); says that it feels like her prior episodes of diverticulitis.  Was very uncomfortable last night and could not sleep.  No fevers, but feels chilly.  No current urinary symptoms.  She had an episode of constipation prior to the pain and thinks that was what had triggered this flare.  Has issues with constipation on and off. Her last episode of diverticulitis was in 11/2011 and treated with a course of antibiotics, responding to Cipro and Flagyl. She says she's had about 9 episodes of diverticulitis altogether generally 1 or 2 every year. She has discussed to surgery with Dr. Michaell Cowing, but is very hesitant to proceed.   Current Medications, Allergies, Past Medical History, Past Surgical History, Family History and Social History were reviewed in [employer] record.   Physical Exam: BP 112/74  Pulse 68  Temp(Src) 97.9 F (36.6 C)  Ht 5' 5.5" (1.664 m)  Wt 128 lb (58.06 kg)  BMI 20.97 kg/m2 General: Well developed, white female in no acute distress Head: Normocephalic and atraumatic Eyes:  sclerae anicteric, conjunctiva pink  Ears: Normal auditory acuity Lungs: Clear throughout to auscultation Heart: Regular rate and rhythm Abdomen: Soft, non-distended. No masses, no hepatomegaly. Normal bowel sounds.  LLQ and suprapubic TTP without R/R/G. Musculoskeletal: Symmetrical with no gross deformities  Extremities: No edema  Neurological: Alert oriented x 4, grossly  nonfocal Psychological:  Alert and cooperative. Normal mood and affect  Assessment and Recommendations: -LLQ abdominal pain:  Recurrent sigmoid diverticulitis.  Several episodes over the past 4 years but has not required hospitalization. -History of hamartomatous colon polyp  *Recommended clear liquids for the next 24 hours and advance as tolerated. *Start cipro 500 mg BID x 14 days and flagyl 500 mg BID x 14 days. *Has Robinul Forte at home that she can take prn for cramping and spasm.   *Will start daily Metamucil or Miralax once symptoms have resolved. *Will return to see surgeons about possible elective resection.

## 2012-10-01 NOTE — Progress Notes (Signed)
Reviewed and agree with management plan.  Lillyauna Jenkinson T. Geanna Divirgilio, MD FACG 

## 2013-03-11 ENCOUNTER — Institutional Professional Consult (permissible substitution): Payer: BC Managed Care – PPO | Admitting: Pulmonary Disease

## 2014-09-23 ENCOUNTER — Telehealth: Payer: Self-pay | Admitting: Gastroenterology

## 2014-09-23 NOTE — Telephone Encounter (Signed)
Per pt disregard message,. She is going to see her PCP today.

## 2015-07-28 DIAGNOSIS — Z08 Encounter for follow-up examination after completed treatment for malignant neoplasm: Secondary | ICD-10-CM | POA: Diagnosis not present

## 2015-07-28 DIAGNOSIS — L818 Other specified disorders of pigmentation: Secondary | ICD-10-CM | POA: Diagnosis not present

## 2015-07-28 DIAGNOSIS — D225 Melanocytic nevi of trunk: Secondary | ICD-10-CM | POA: Diagnosis not present

## 2015-07-28 DIAGNOSIS — Z1283 Encounter for screening for malignant neoplasm of skin: Secondary | ICD-10-CM | POA: Diagnosis not present

## 2015-07-28 DIAGNOSIS — L814 Other melanin hyperpigmentation: Secondary | ICD-10-CM | POA: Diagnosis not present

## 2015-07-28 DIAGNOSIS — Z85820 Personal history of malignant melanoma of skin: Secondary | ICD-10-CM | POA: Diagnosis not present

## 2015-09-08 DIAGNOSIS — K649 Unspecified hemorrhoids: Secondary | ICD-10-CM | POA: Diagnosis not present

## 2016-01-12 DIAGNOSIS — D235 Other benign neoplasm of skin of trunk: Secondary | ICD-10-CM | POA: Diagnosis not present

## 2016-01-12 DIAGNOSIS — Z08 Encounter for follow-up examination after completed treatment for malignant neoplasm: Secondary | ICD-10-CM | POA: Diagnosis not present

## 2016-01-12 DIAGNOSIS — Z85820 Personal history of malignant melanoma of skin: Secondary | ICD-10-CM | POA: Diagnosis not present

## 2016-03-08 DIAGNOSIS — J029 Acute pharyngitis, unspecified: Secondary | ICD-10-CM | POA: Diagnosis not present

## 2016-05-10 DIAGNOSIS — Z131 Encounter for screening for diabetes mellitus: Secondary | ICD-10-CM | POA: Diagnosis not present

## 2016-05-10 DIAGNOSIS — Z6823 Body mass index (BMI) 23.0-23.9, adult: Secondary | ICD-10-CM | POA: Diagnosis not present

## 2016-05-10 DIAGNOSIS — Z01419 Encounter for gynecological examination (general) (routine) without abnormal findings: Secondary | ICD-10-CM | POA: Diagnosis not present

## 2016-11-15 DIAGNOSIS — Z1283 Encounter for screening for malignant neoplasm of skin: Secondary | ICD-10-CM | POA: Diagnosis not present

## 2016-11-15 DIAGNOSIS — D2272 Melanocytic nevi of left lower limb, including hip: Secondary | ICD-10-CM | POA: Diagnosis not present

## 2016-11-15 DIAGNOSIS — Z08 Encounter for follow-up examination after completed treatment for malignant neoplasm: Secondary | ICD-10-CM | POA: Diagnosis not present

## 2016-11-15 DIAGNOSIS — D225 Melanocytic nevi of trunk: Secondary | ICD-10-CM | POA: Diagnosis not present

## 2016-11-15 DIAGNOSIS — Z85820 Personal history of malignant melanoma of skin: Secondary | ICD-10-CM | POA: Diagnosis not present

## 2016-11-15 DIAGNOSIS — D485 Neoplasm of uncertain behavior of skin: Secondary | ICD-10-CM | POA: Diagnosis not present

## 2016-11-15 DIAGNOSIS — L821 Other seborrheic keratosis: Secondary | ICD-10-CM | POA: Diagnosis not present

## 2016-11-22 DIAGNOSIS — L089 Local infection of the skin and subcutaneous tissue, unspecified: Secondary | ICD-10-CM | POA: Diagnosis not present

## 2016-11-22 DIAGNOSIS — D485 Neoplasm of uncertain behavior of skin: Secondary | ICD-10-CM | POA: Diagnosis not present

## 2016-12-25 DIAGNOSIS — K5792 Diverticulitis of intestine, part unspecified, without perforation or abscess without bleeding: Secondary | ICD-10-CM | POA: Diagnosis not present

## 2017-05-07 DIAGNOSIS — J029 Acute pharyngitis, unspecified: Secondary | ICD-10-CM | POA: Diagnosis not present

## 2017-05-07 DIAGNOSIS — R52 Pain, unspecified: Secondary | ICD-10-CM | POA: Diagnosis not present

## 2017-05-09 DIAGNOSIS — D235 Other benign neoplasm of skin of trunk: Secondary | ICD-10-CM | POA: Diagnosis not present

## 2017-05-09 DIAGNOSIS — Z85820 Personal history of malignant melanoma of skin: Secondary | ICD-10-CM | POA: Diagnosis not present

## 2017-05-09 DIAGNOSIS — I781 Nevus, non-neoplastic: Secondary | ICD-10-CM | POA: Diagnosis not present

## 2017-05-09 DIAGNOSIS — D225 Melanocytic nevi of trunk: Secondary | ICD-10-CM | POA: Diagnosis not present

## 2017-05-09 DIAGNOSIS — Z08 Encounter for follow-up examination after completed treatment for malignant neoplasm: Secondary | ICD-10-CM | POA: Diagnosis not present

## 2017-05-09 DIAGNOSIS — Z1283 Encounter for screening for malignant neoplasm of skin: Secondary | ICD-10-CM | POA: Diagnosis not present

## 2017-05-23 DIAGNOSIS — Z13820 Encounter for screening for osteoporosis: Secondary | ICD-10-CM | POA: Diagnosis not present

## 2017-05-23 DIAGNOSIS — Z6823 Body mass index (BMI) 23.0-23.9, adult: Secondary | ICD-10-CM | POA: Diagnosis not present

## 2017-05-23 DIAGNOSIS — Z01419 Encounter for gynecological examination (general) (routine) without abnormal findings: Secondary | ICD-10-CM | POA: Diagnosis not present

## 2017-05-23 DIAGNOSIS — Z1231 Encounter for screening mammogram for malignant neoplasm of breast: Secondary | ICD-10-CM | POA: Diagnosis not present

## 2017-05-26 ENCOUNTER — Other Ambulatory Visit: Payer: Self-pay | Admitting: Obstetrics and Gynecology

## 2017-05-26 DIAGNOSIS — R928 Other abnormal and inconclusive findings on diagnostic imaging of breast: Secondary | ICD-10-CM

## 2017-05-30 ENCOUNTER — Ambulatory Visit
Admission: RE | Admit: 2017-05-30 | Discharge: 2017-05-30 | Disposition: A | Discharge status: Home / Self Care | Payer: BLUE CROSS/BLUE SHIELD | Source: Ambulatory Visit | Attending: Obstetrics and Gynecology | Admitting: Obstetrics and Gynecology

## 2017-05-30 DIAGNOSIS — R928 Other abnormal and inconclusive findings on diagnostic imaging of breast: Secondary | ICD-10-CM

## 2017-05-30 DIAGNOSIS — N6489 Other specified disorders of breast: Secondary | ICD-10-CM | POA: Diagnosis not present

## 2017-11-07 DIAGNOSIS — Z1283 Encounter for screening for malignant neoplasm of skin: Secondary | ICD-10-CM | POA: Diagnosis not present

## 2017-11-07 DIAGNOSIS — D225 Melanocytic nevi of trunk: Secondary | ICD-10-CM | POA: Diagnosis not present

## 2017-11-07 DIAGNOSIS — Z85820 Personal history of malignant melanoma of skin: Secondary | ICD-10-CM | POA: Diagnosis not present

## 2017-11-07 DIAGNOSIS — Z08 Encounter for follow-up examination after completed treatment for malignant neoplasm: Secondary | ICD-10-CM | POA: Diagnosis not present

## 2017-11-28 DIAGNOSIS — K219 Gastro-esophageal reflux disease without esophagitis: Secondary | ICD-10-CM | POA: Diagnosis not present

## 2017-11-28 DIAGNOSIS — E782 Mixed hyperlipidemia: Secondary | ICD-10-CM | POA: Diagnosis not present

## 2017-12-18 DIAGNOSIS — J0100 Acute maxillary sinusitis, unspecified: Secondary | ICD-10-CM | POA: Diagnosis not present

## 2018-01-22 DIAGNOSIS — R0781 Pleurodynia: Secondary | ICD-10-CM | POA: Diagnosis not present

## 2018-01-22 DIAGNOSIS — S20212A Contusion of left front wall of thorax, initial encounter: Secondary | ICD-10-CM | POA: Diagnosis not present

## 2018-02-06 DIAGNOSIS — K5732 Diverticulitis of large intestine without perforation or abscess without bleeding: Secondary | ICD-10-CM | POA: Diagnosis not present

## 2018-02-13 DIAGNOSIS — K5732 Diverticulitis of large intestine without perforation or abscess without bleeding: Secondary | ICD-10-CM | POA: Diagnosis not present

## 2018-06-12 DIAGNOSIS — K219 Gastro-esophageal reflux disease without esophagitis: Secondary | ICD-10-CM | POA: Diagnosis not present

## 2018-06-12 DIAGNOSIS — E7800 Pure hypercholesterolemia, unspecified: Secondary | ICD-10-CM | POA: Diagnosis not present

## 2018-06-12 DIAGNOSIS — K5732 Diverticulitis of large intestine without perforation or abscess without bleeding: Secondary | ICD-10-CM | POA: Diagnosis not present

## 2018-06-15 DIAGNOSIS — D225 Melanocytic nevi of trunk: Secondary | ICD-10-CM | POA: Diagnosis not present

## 2018-06-26 DIAGNOSIS — D485 Neoplasm of uncertain behavior of skin: Secondary | ICD-10-CM | POA: Diagnosis not present

## 2018-06-26 DIAGNOSIS — Z08 Encounter for follow-up examination after completed treatment for malignant neoplasm: Secondary | ICD-10-CM | POA: Diagnosis not present

## 2018-06-26 DIAGNOSIS — Z85820 Personal history of malignant melanoma of skin: Secondary | ICD-10-CM | POA: Diagnosis not present

## 2018-06-26 DIAGNOSIS — Z1283 Encounter for screening for malignant neoplasm of skin: Secondary | ICD-10-CM | POA: Diagnosis not present

## 2018-06-26 DIAGNOSIS — D225 Melanocytic nevi of trunk: Secondary | ICD-10-CM | POA: Diagnosis not present

## 2018-07-03 DIAGNOSIS — Z01419 Encounter for gynecological examination (general) (routine) without abnormal findings: Secondary | ICD-10-CM | POA: Diagnosis not present

## 2018-07-03 DIAGNOSIS — Z6821 Body mass index (BMI) 21.0-21.9, adult: Secondary | ICD-10-CM | POA: Diagnosis not present

## 2018-07-03 DIAGNOSIS — Z1231 Encounter for screening mammogram for malignant neoplasm of breast: Secondary | ICD-10-CM | POA: Diagnosis not present

## 2018-07-10 DIAGNOSIS — Z1159 Encounter for screening for other viral diseases: Secondary | ICD-10-CM | POA: Diagnosis not present

## 2018-07-10 DIAGNOSIS — Z79899 Other long term (current) drug therapy: Secondary | ICD-10-CM | POA: Diagnosis not present

## 2018-07-10 DIAGNOSIS — E7800 Pure hypercholesterolemia, unspecified: Secondary | ICD-10-CM | POA: Diagnosis not present

## 2018-07-10 DIAGNOSIS — K5732 Diverticulitis of large intestine without perforation or abscess without bleeding: Secondary | ICD-10-CM | POA: Diagnosis not present

## 2018-08-07 DIAGNOSIS — C44622 Squamous cell carcinoma of skin of right upper limb, including shoulder: Secondary | ICD-10-CM | POA: Diagnosis not present

## 2019-02-14 DIAGNOSIS — Z03818 Encounter for observation for suspected exposure to other biological agents ruled out: Secondary | ICD-10-CM | POA: Diagnosis not present

## 2019-02-14 DIAGNOSIS — Z20828 Contact with and (suspected) exposure to other viral communicable diseases: Secondary | ICD-10-CM | POA: Diagnosis not present

## 2019-02-15 ENCOUNTER — Other Ambulatory Visit: Payer: BLUE CROSS/BLUE SHIELD

## 2019-04-09 DIAGNOSIS — K5732 Diverticulitis of large intestine without perforation or abscess without bleeding: Secondary | ICD-10-CM | POA: Diagnosis not present

## 2019-05-31 DIAGNOSIS — Z1283 Encounter for screening for malignant neoplasm of skin: Secondary | ICD-10-CM | POA: Diagnosis not present

## 2019-05-31 DIAGNOSIS — C44722 Squamous cell carcinoma of skin of right lower limb, including hip: Secondary | ICD-10-CM | POA: Diagnosis not present

## 2019-05-31 DIAGNOSIS — L820 Inflamed seborrheic keratosis: Secondary | ICD-10-CM | POA: Diagnosis not present

## 2019-06-13 IMAGING — MG DIGITAL DIAGNOSTIC UNILATERAL LEFT MAMMOGRAM WITH TOMO AND CAD
4 series · 4 of 12 positions shown · non-contrast
Comparison: Previous exam(s).

CLINICAL DATA: Patient presents for additional views of the left
breast as followup to a recent screening exam to evaluate possible
distortion.

EXAM:
DIGITAL DIAGNOSTIC left MAMMOGRAM WITH TOMO
ULTRASOUND left BREAST

[L MLO synth-2D]
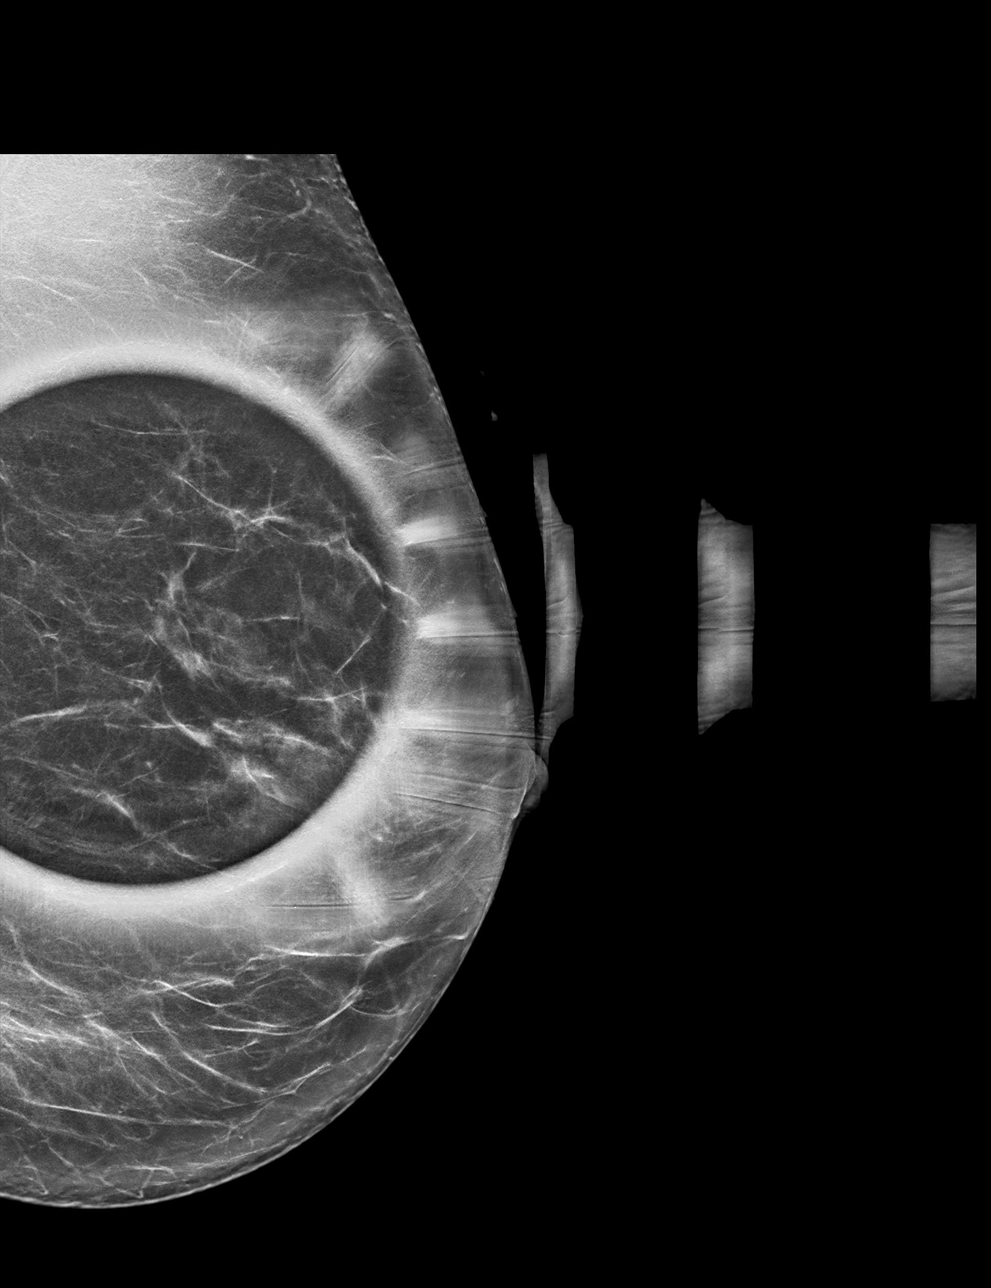

[L CC synth-2D]
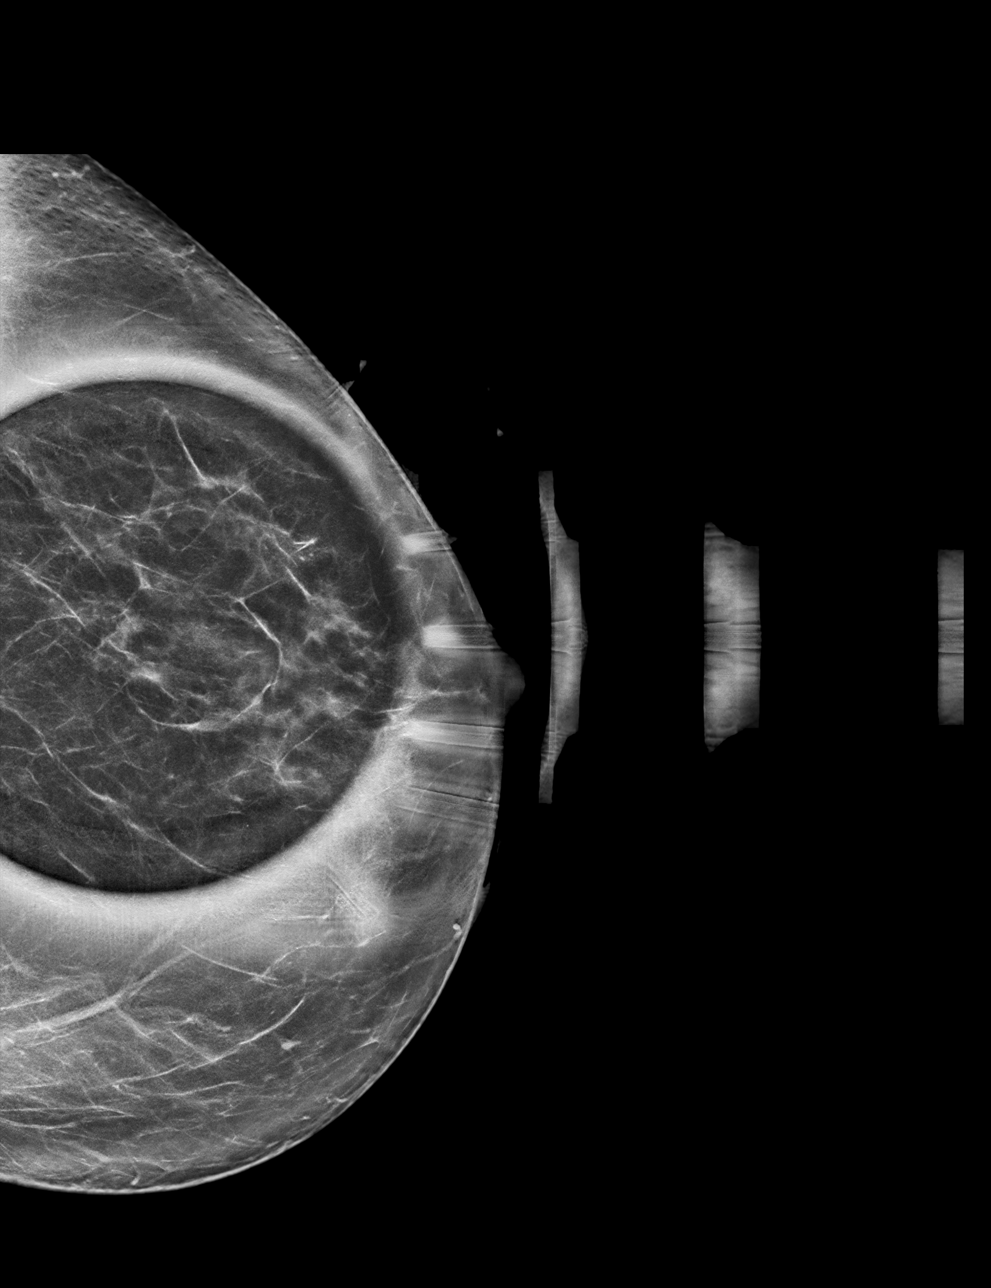

[L MLO tomo · tomo slice 33/66.0]
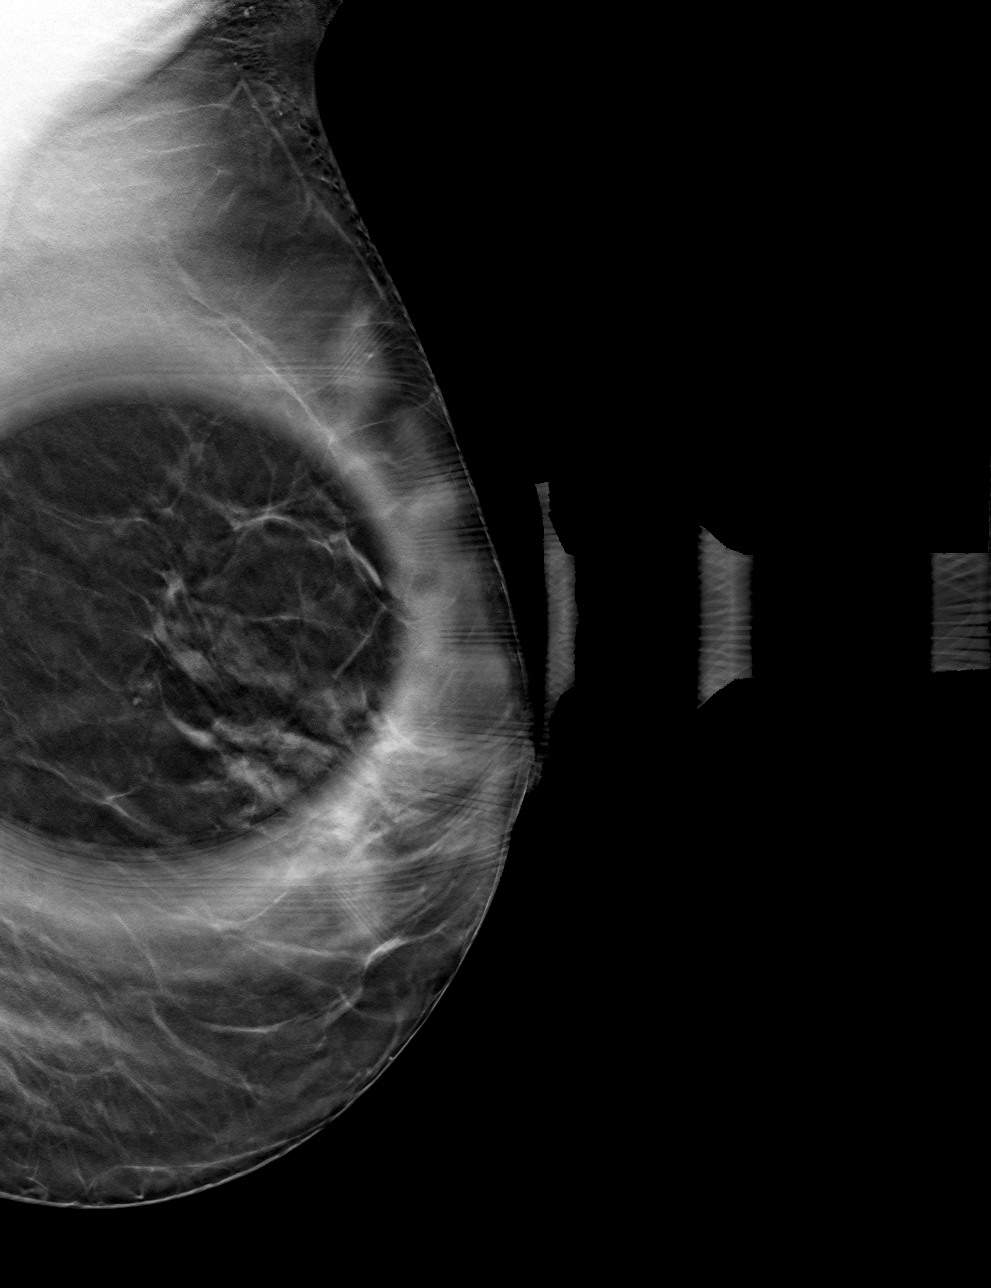

[L CC tomo · tomo slice 35/69.0]
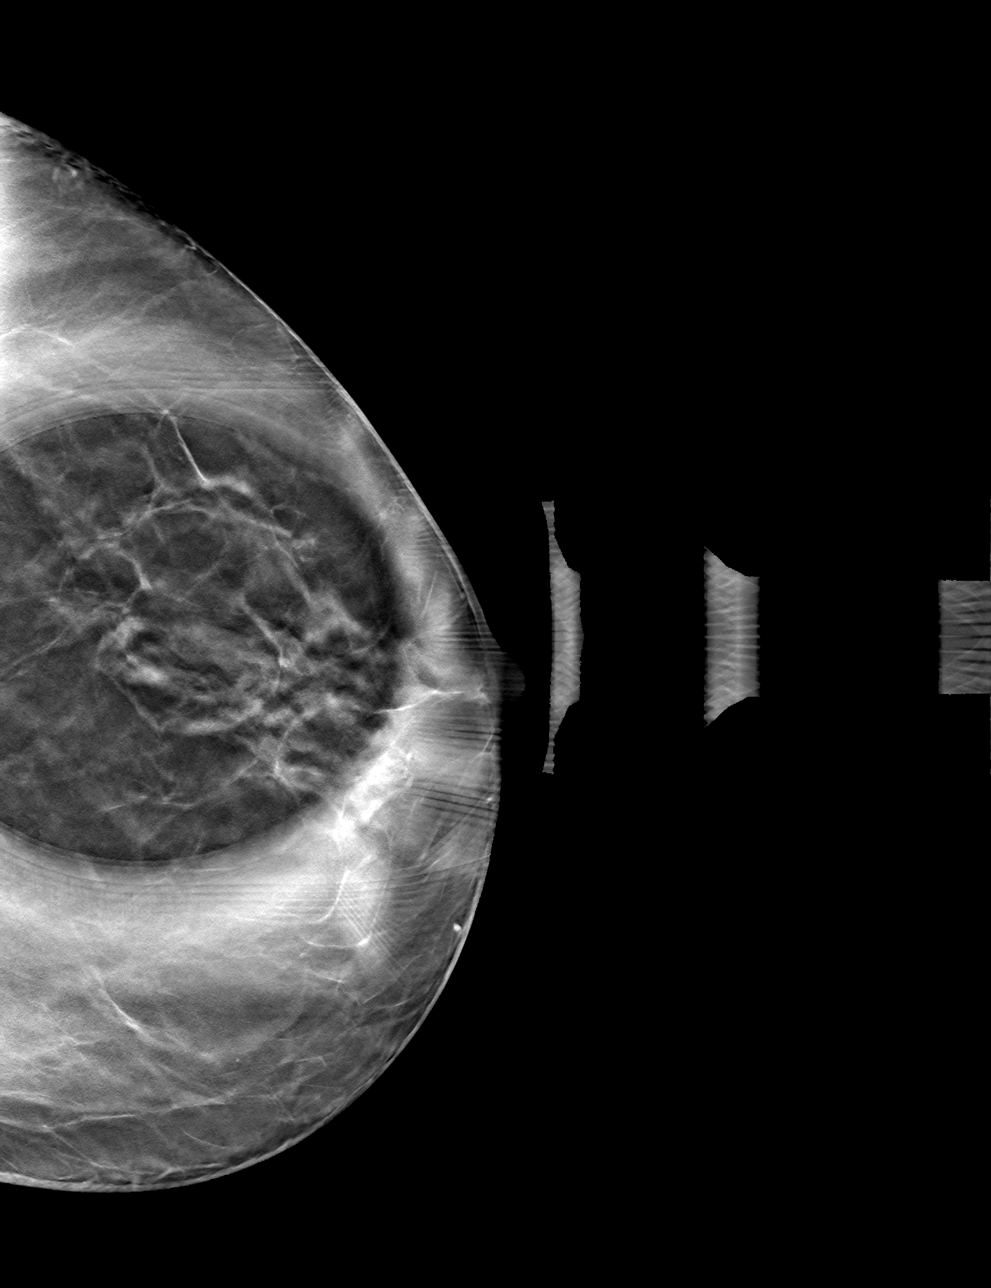

[4 of 12 positions shown; findings below may reference images not displayed]

ACR Breast Density Category b: There are scattered areas of
fibroglandular density.
FINDINGS: Spot compression tomographic images demonstrate no focal distortion
over the upper central left breast. There is minimal asymmetric
density in this location unchanged from prior exams likely
overlapping fibroglandular tissue.

Targeted ultrasound is performed, showing no focal abnormality over
the upper central left breast.
IMPRESSION: No focal distortion over the upper central left breast.

RECOMMENDATION:
Recommend continued annual bilateral screening mammographic
follow-up.

I have discussed the findings and recommendations with the patient.
Results were also provided in writing at the conclusion of the
visit. If applicable, a reminder letter will be sent to the patient
regarding the next appointment.

BI-RADS CATEGORY  1: Negative.

## 2019-08-06 DIAGNOSIS — Z01419 Encounter for gynecological examination (general) (routine) without abnormal findings: Secondary | ICD-10-CM | POA: Diagnosis not present

## 2019-08-06 DIAGNOSIS — Z1231 Encounter for screening mammogram for malignant neoplasm of breast: Secondary | ICD-10-CM | POA: Diagnosis not present

## 2019-08-06 DIAGNOSIS — Z6822 Body mass index (BMI) 22.0-22.9, adult: Secondary | ICD-10-CM | POA: Diagnosis not present

## 2019-08-06 DIAGNOSIS — Z13820 Encounter for screening for osteoporosis: Secondary | ICD-10-CM | POA: Diagnosis not present

## 2019-08-20 DIAGNOSIS — Z85820 Personal history of malignant melanoma of skin: Secondary | ICD-10-CM | POA: Diagnosis not present

## 2019-08-20 DIAGNOSIS — Z08 Encounter for follow-up examination after completed treatment for malignant neoplasm: Secondary | ICD-10-CM | POA: Diagnosis not present

## 2019-08-20 DIAGNOSIS — Z1283 Encounter for screening for malignant neoplasm of skin: Secondary | ICD-10-CM | POA: Diagnosis not present

## 2019-08-20 DIAGNOSIS — D225 Melanocytic nevi of trunk: Secondary | ICD-10-CM | POA: Diagnosis not present

## 2019-09-10 DIAGNOSIS — Z79899 Other long term (current) drug therapy: Secondary | ICD-10-CM | POA: Diagnosis not present

## 2019-09-10 DIAGNOSIS — Z6822 Body mass index (BMI) 22.0-22.9, adult: Secondary | ICD-10-CM | POA: Diagnosis not present

## 2019-09-10 DIAGNOSIS — Z23 Encounter for immunization: Secondary | ICD-10-CM | POA: Diagnosis not present

## 2019-09-10 DIAGNOSIS — E7800 Pure hypercholesterolemia, unspecified: Secondary | ICD-10-CM | POA: Diagnosis not present

## 2021-03-02 DIAGNOSIS — Z1283 Encounter for screening for malignant neoplasm of skin: Secondary | ICD-10-CM | POA: Diagnosis not present

## 2021-03-02 DIAGNOSIS — Z85820 Personal history of malignant melanoma of skin: Secondary | ICD-10-CM | POA: Diagnosis not present

## 2021-03-02 DIAGNOSIS — Z08 Encounter for follow-up examination after completed treatment for malignant neoplasm: Secondary | ICD-10-CM | POA: Diagnosis not present

## 2021-03-02 DIAGNOSIS — L02229 Furuncle of trunk, unspecified: Secondary | ICD-10-CM | POA: Diagnosis not present

## 2021-03-02 DIAGNOSIS — X32XXXD Exposure to sunlight, subsequent encounter: Secondary | ICD-10-CM | POA: Diagnosis not present

## 2021-03-02 DIAGNOSIS — L570 Actinic keratosis: Secondary | ICD-10-CM | POA: Diagnosis not present

## 2021-04-27 ENCOUNTER — Ambulatory Visit (INDEPENDENT_AMBULATORY_CARE_PROVIDER_SITE_OTHER): Payer: BC Managed Care – PPO | Admitting: Gastroenterology

## 2021-04-27 ENCOUNTER — Encounter: Payer: Self-pay | Admitting: Gastroenterology

## 2021-04-27 DIAGNOSIS — K219 Gastro-esophageal reflux disease without esophagitis: Secondary | ICD-10-CM | POA: Diagnosis not present

## 2021-04-27 DIAGNOSIS — Z1211 Encounter for screening for malignant neoplasm of colon: Secondary | ICD-10-CM | POA: Diagnosis not present

## 2021-04-27 MED ORDER — NA SULFATE-K SULFATE-MG SULF 17.5-3.13-1.6 GM/177ML PO SOLN: 1.0000 | ORAL | 0 refills | Status: DC

## 2021-04-27 MED ORDER — HYOSCYAMINE SULFATE 0.125 MG SL SUBL: SUBLINGUAL_TABLET | SUBLINGUAL | 1 refills | Status: AC

## 2021-04-27 NOTE — Progress Notes (Signed)
? ? ? ?04/27/2021 ?Cheryl Fisher ?[id number] ?Jan 08, 1966 ? ? ?HISTORY OF PRESENT ILLNESS:  This is a pleasant 56 year old female who is a patient of Dr. Lynne Leader, not seen here in about 9 years.  She has a history of recurrent diverticulitis, but says that she has been taking Metamucil regularly and has not needed abx for diverticulitis in about 3 years now.  Used to be treated with cipro and flagyl for 10 days, which always worked for her.  She also reports that her bowel habits tend to alternate between constipation and diarrhea, but the Metamucil has seemed to help that as well.  She reports some intermittent abdominal cramping.  Thinks that her anxiety as gotten worsen with age/menopause.  No rectal bleeding. ? ?Colonoscopy June 2013 showed only diverticulosis with a recommended 10-year repeat colonoscopy. ? ?She also reports having more issues with reflux over the past few years.  Says that she has woken up at night with severe symptoms on a few occasions.  She is only using pepcid 10 mg prn right now.  Never had an EGD in the past. ? ? ?Past Medical History:  ?Diagnosis Date  ? Anxiety   ? Cardiac arrhythmia   ? Colon polyps 01/15/2007  ? [place] hamartomous, not adenomatous  ? Diverticular disease   ? GERD (gastroesophageal reflux disease)   ? Hematuria - cause not known   ? Hyperlipidemia   ? Malignant melanoma of skin of forearm, left ([place])   ? 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 ?Past Surgical History:  ?Procedure Laterality Date  ? neg hx    ? ? reports that she quit smoking about 31 years ago. She has never used smokeless tobacco. She reports current alcohol use. She reports that she does not use drugs. ?family history includes Cancer in her father; Colitis in an other family member; Colon polyps in her father and mother; Diverticulitis in her brother, maternal grandmother, and mother; Hyperlipidemia in her father and mother; Hypertension in her brother, father, and mother; Melanoma in her paternal  grandfather. ?Allergies  ?Allergen Reactions  ? Bacitracin   ?  Other reaction(s): Unknown  ? Neomycin   ?  Contact Dermititis  ? Retapamulin   ?  Other reaction(s): Dizziness  ? Sulfa Antibiotics Hives  ? Tape   ?  Contact Dermititis  ? ? ?  ?Outpatient Encounter Medications as of 04/27/2021  ?Medication Sig  ? ALPRAZolam (XANAX) 0.5 MG tablet Take 0.5 mg by mouth as needed.   ? atorvastatin (LIPITOR) 20 MG tablet Take 20 mg by mouth daily.  ? B Complex Vitamins (VITAMIN B COMPLEX) TABS Take 1 tablet by mouth daily.  ? Cholecalciferol (VITAMIN D3 PO) Take 1 tablet by mouth daily.  ? clobetasol cream (TEMOVATE) 9.56 % Apply 1 application. topically as needed.  ? escitalopram (LEXAPRO) 10 MG tablet Take 10 mg by mouth daily.  ? famotidine (PEPCID) 20 MG tablet Take 20 mg by mouth daily.  ? Lactobacillus Rhamnosus, GG, (RA PROBIOTIC DIGESTIVE CARE) CAPS Take 1 capsule by mouth daily.  ? Melatonin 5 MG CAPS Take 1 tablet by mouth at bedtime.  ? Multiple Vitamin (MULTIVITAMIN) tablet Take 1 tablet by mouth daily.  ? OMEGA 3 1200 MG CAPS Take by mouth.  ? Probiotic Product (PROBIOTIC PO) Take by mouth.  ? tretinoin (RETIN-A) 0.025 % cream Apply 1 application. topically 2 (two) times a week.  ? Turmeric 500 MG CAPS Take 1 tablet by mouth daily.  ?  vitamin E 180 MG (400 UNITS) capsule Take 1 capsule by mouth daily.  ? [DISCONTINUED] diphenhydrAMINE (SOMINEX) 25 MG tablet Take 25 mg by mouth at bedtime as needed.  ? [DISCONTINUED] ERRIN 0.35 MG tablet   ? [DISCONTINUED] glycopyrrolate (ROBINUL) 2 MG tablet Take 2 times daily as needed for cramping and spasms.  ? [DISCONTINUED] metroNIDAZOLE (FLAGYL) 500 MG tablet Take 1 tab twice daily for 14 days.  ? ?No facility-administered encounter medications on file as of 04/27/2021.  ? ? ? ?REVIEW OF SYSTEMS  : All other systems reviewed and negative except where noted in the History of Present Illness. ? ? ?PHYSICAL EXAM: ?BP 134/80 (BP Location: Left Arm, Patient Position: Sitting,  Cuff Size: Normal)   Pulse 68   Ht 5' 5.5" (1.664 m) Comment: height measured without shoes  Wt 140 lb 8 oz (63.7 kg)   BMI 23.02 kg/m?  ?General: Well developed white female in no acute distress ?Head: Normocephalic and atraumatic ?Eyes:  Sclerae anicteric, conjunctiva pink. ?Ears: Normal auditory acuity ?Lungs: Clear throughout to auscultation; no W/R/R. ?Heart: Regular rate and rhythm; no M/R/G. ?Abdomen: Soft, non-distended.  BS present.  Non-tender. ?Rectal:  Will be done at the time of colonoscopy. ?Musculoskeletal: Symmetrical with no gross deformities  ?Skin: No lesions on visible extremities ?Extremities: No edema  ?Neurological: Alert oriented x 4, grossly non-focal ?Psychological:  Alert and cooperative. Normal mood and affect ? ?ASSESSMENT AND PLAN: ?*CRC screening:  Last colonoscopy was 10 years ago.  Will schedule wit Dr. Fuller Plan. ?*GERD:  Intermittent reflux symptoms, some at nighttime as well.  Random nausea.  Taking pepcid 10 mg prn right now.  We discussed using this regularly.  Never had an EGD in the past.  Will plan for EGD with Dr. Fuller Plan as well. ?*History of recurrent diverticulitis:  No issues in about 3 years now. ?*Possible IBS:  Stools alternate between constipation and diarrhea.  Metamucil has helped regulate bowels some.  Has some abdominal cramping.  Will try levsin 0.125 mg SL prn.  Prescription sent to pharmacy. ? ?**The risks, benefits, and alternatives to EGD and colonoscopy were discussed with the patient and she consents to proceed.  ? ?CC:  Kathyrn Lass, MD ? ?  ?

## 2021-04-27 NOTE — Patient Instructions (Addendum)
If you are age 58 or younger, your body mass index should be between 19-25. Your Body mass index is 23.02 kg/m?Marland Kitchen If this is out of the aformentioned range listed, please consider follow up with your Primary Care Provider.  ?________________________________________________________ ? ?The  GI providers would like to encourage you to use [facility] to communicate with providers for non-urgent requests or questions.  Due to long hold times on the telephone, sending your provider a message by [facility] may be a faster and more efficient way to get a response.  Please allow 48 business hours for a response.  Please remember that this is for non-urgent requests.  ?_______________________________________________________ ? ?We have sent the following medications to your pharmacy for you to pick up at your convenience: ? ?START: Levsin 0.125 mg sublingual (under the tongue) every 6 to 8 hours as needed.  ? ?You have been scheduled for an endoscopy and colonoscopy. Please follow the written instructions given to you at your visit today. ?Please pick up your prep supplies at the pharmacy within the next 1-3 days. ?If you use inhalers (even only as needed), please bring them with you on the day of your procedure. ? ?Due to recent changes in healthcare laws, you may see the results of your imaging and laboratory studies on MyChart before your provider has had a chance to review them.  We understand that in some cases there may be results that are confusing or concerning to you. Not all laboratory results come back in the same time frame and the provider may be waiting for multiple results in order to interpret others.  Please give [place] 48 hours in order for your provider to thoroughly review all the results before contacting the office for clarification of your results.  ? ?Thank you for entrusting me with your care and choosing [facility]. ? ?Alonza Bogus, PA-C ?

## 2021-06-14 ENCOUNTER — Encounter: Payer: Self-pay | Admitting: Gastroenterology

## 2021-06-17 ENCOUNTER — Encounter: Payer: Self-pay | Admitting: Certified Registered Nurse Anesthetist

## 2021-06-20 ENCOUNTER — Encounter: Payer: Self-pay | Admitting: Certified Registered Nurse Anesthetist

## 2021-06-21 ENCOUNTER — Ambulatory Visit (AMBULATORY_SURGERY_CENTER): Payer: BC Managed Care – PPO | Admitting: Gastroenterology

## 2021-06-21 ENCOUNTER — Encounter: Payer: Self-pay | Admitting: Gastroenterology

## 2021-06-21 VITALS — BP 123/69 | HR 58 | Temp 97.8°F | Resp 10 | Ht 65.0 in | Wt 140.0 lb

## 2021-06-21 DIAGNOSIS — D122 Benign neoplasm of ascending colon: Secondary | ICD-10-CM | POA: Diagnosis not present

## 2021-06-21 DIAGNOSIS — K3189 Other diseases of stomach and duodenum: Secondary | ICD-10-CM | POA: Diagnosis not present

## 2021-06-21 DIAGNOSIS — K449 Diaphragmatic hernia without obstruction or gangrene: Secondary | ICD-10-CM

## 2021-06-21 DIAGNOSIS — K219 Gastro-esophageal reflux disease without esophagitis: Secondary | ICD-10-CM | POA: Diagnosis not present

## 2021-06-21 DIAGNOSIS — Z1211 Encounter for screening for malignant neoplasm of colon: Secondary | ICD-10-CM | POA: Diagnosis not present

## 2021-06-21 DIAGNOSIS — K2289 Other specified disease of esophagus: Secondary | ICD-10-CM | POA: Diagnosis not present

## 2021-06-21 MED ORDER — SODIUM CHLORIDE 0.9 % IV SOLN: 500.0000 mL | Freq: Once | INTRAVENOUS | Status: DC

## 2021-06-21 NOTE — Progress Notes (Signed)
History & Physical  Primary Care Physician:  Kathyrn Lass, MD Primary Gastroenterologist: Lucio Edward, MD  CHIEF COMPLAINT:  CRC screening, GERD   HPI: Cheryl Fisher is a 56 y.o. female CRC screening, average risk, and GERD for colonoscopy and EGD.   Past Medical History:  Diagnosis Date   Anxiety    Cardiac arrhythmia    Colon polyps 01/15/2007   Juvenille hamartomous, not adenomatous   Diverticular disease    GERD (gastroesophageal reflux disease)    Hematuria - cause not known    Hyperlipidemia    Malignant melanoma of skin of forearm, left ([place])    excised.  Neg SLN, x 5    Past Surgical History:  Procedure Laterality Date   neg hx      Prior to Admission medications   Medication Sig Start Date End Date Taking? Authorizing Provider  atorvastatin (LIPITOR) 20 MG tablet Take 20 mg by mouth daily. 03/12/21  Yes [provider]  B Complex Vitamins (VITAMIN B COMPLEX) TABS Take 1 tablet by mouth daily.   Yes [provider]  Cholecalciferol (VITAMIN D3 PO) Take 1 tablet by mouth daily.   Yes [provider]  escitalopram (LEXAPRO) 10 MG tablet Take 10 mg by mouth daily. 03/12/21  Yes [provider]  famotidine (PEPCID) 20 MG tablet Take 20 mg by mouth daily.   Yes [provider]  hyoscyamine (LEVSIN SL) 0.125 MG SL tablet Take one tablet sublingual (under the tongue) every 6 to 8 hours as needed 04/27/21  Yes Zehr, Janett Billow D, PA-C  Lactobacillus Rhamnosus, GG, (RA PROBIOTIC DIGESTIVE CARE) CAPS Take 1 capsule by mouth daily.   Yes [provider]  Melatonin 5 MG CAPS Take 1 tablet by mouth at bedtime.   Yes [provider]  Multiple Vitamin (MULTIVITAMIN) tablet Take 1 tablet by mouth daily.   Yes [provider]  OMEGA 3 1200 MG CAPS Take by mouth.   Yes [provider]  Probiotic Product (PROBIOTIC PO) Take by mouth.   Yes [provider]  tretinoin (RETIN-A) 0.025 % cream Apply 1  application. topically 2 (two) times a week.   Yes [provider]  Turmeric 500 MG CAPS Take 1 tablet by mouth daily.   Yes [provider]  vitamin E 180 MG (400 UNITS) capsule Take 1 capsule by mouth daily.   Yes [provider]  ALPRAZolam Duanne Moron) 0.5 MG tablet Take 0.5 mg by mouth as needed.  05/14/11   [provider]  clobetasol cream (TEMOVATE) 2.35 % Apply 1 application. topically as needed. Patient not taking: Reported on 06/21/2021 03/02/12   [provider]    Current Outpatient Medications  Medication Sig Dispense Refill   atorvastatin (LIPITOR) 20 MG tablet Take 20 mg by mouth daily.     B Complex Vitamins (VITAMIN B COMPLEX) TABS Take 1 tablet by mouth daily.     Cholecalciferol (VITAMIN D3 PO) Take 1 tablet by mouth daily.     escitalopram (LEXAPRO) 10 MG tablet Take 10 mg by mouth daily.     famotidine (PEPCID) 20 MG tablet Take 20 mg by mouth daily.     hyoscyamine (LEVSIN SL) 0.125 MG SL tablet Take one tablet sublingual (under the tongue) every 6 to 8 hours as needed 15 tablet 1   Lactobacillus Rhamnosus, GG, (RA PROBIOTIC DIGESTIVE CARE) CAPS Take 1 capsule by mouth daily.     Melatonin 5 MG CAPS Take 1 tablet by mouth  at bedtime.     Multiple Vitamin (MULTIVITAMIN) tablet Take 1 tablet by mouth daily.     OMEGA 3 1200 MG CAPS Take by mouth.     Probiotic Product (PROBIOTIC PO) Take by mouth.     tretinoin (RETIN-A) 0.025 % cream Apply 1 application. topically 2 (two) times a week.     Turmeric 500 MG CAPS Take 1 tablet by mouth daily.     vitamin E 180 MG (400 UNITS) capsule Take 1 capsule by mouth daily.     ALPRAZolam (XANAX) 0.5 MG tablet Take 0.5 mg by mouth as needed.      clobetasol cream (TEMOVATE) 1.61 % Apply 1 application. topically as needed. (Patient not taking: Reported on 06/21/2021)     Current Facility-Administered Medications  Medication Dose Route Frequency Provider Last Rate Last Admin   0.9 %  sodium  chloride infusion  500 mL Intravenous Once Ladene Artist, MD        Allergies as of 06/21/2021 - Review Complete 06/21/2021  Allergen Reaction Noted   Bacitracin  04/27/2021   Neomycin  06/03/2011   Retapamulin  04/27/2021   Sulfa antibiotics Hives 06/03/2011   Tape  06/03/2011    Family History  Problem Relation Age of Onset   Diverticulitis Mother    Colon polyps Mother    Hypertension Mother    Hyperlipidemia Mother    Cancer Father        sarcoma, leg   Colon polyps Father    Hypertension Father    Hyperlipidemia Father    Diverticulitis Brother    Hypertension Brother    Diverticulitis Maternal Grandmother    Melanoma Paternal Grandfather    Colitis Other        pat cousin   Colon cancer Neg Hx    Esophageal cancer Neg Hx    Stomach cancer Neg Hx    Rectal cancer Neg Hx     Social History   Socioeconomic History   Marital status: Married    Spouse name: Not on file   Number of children: 1   Years of education: Not on file   Highest education level: Not on file  Occupational History   Occupation: dental hygentist    Employer: [employer]    Employer: [employer]  Tobacco Use   Smoking status: Former    Types: Cigarettes    Quit date: 01/14/1990    Years since quitting: 31.4   Smokeless tobacco: Never  Vaping Use   Vaping Use: Never used  Substance and Sexual Activity   Alcohol use: Yes    Comment: 1-2 per day   Drug use: No   Sexual activity: Not on file  Other Topics Concern   Not on file  Social History Narrative   1 adopted child from [place]   Social Determinants of Health   Financial Resource Strain: Not on file  Food Insecurity: Not on file  Transportation Needs: Not on file  Physical Activity: Not on file  Stress: Not on file  Social Connections: Not on file  Intimate Partner Violence: Not on file    Review of Systems:  All systems reviewed an negative except where noted in HPI.  Gen: Denies any fever, chills,  sweats, anorexia, fatigue, weakness, malaise, weight loss, and sleep disorder CV: Denies chest pain, angina, palpitations, syncope, orthopnea, PND, peripheral edema, and claudication. Resp: Denies dyspnea at rest, dyspnea with exercise, cough, sputum, wheezing, coughing up blood, and pleurisy. GI: Denies  vomiting blood, jaundice, and fecal incontinence.   Denies dysphagia or odynophagia. GU : Denies urinary burning, blood in urine, urinary frequency, urinary hesitancy, nocturnal urination, and urinary incontinence. MS: Denies joint pain, limitation of movement, and swelling, stiffness, low back pain, extremity pain. Denies muscle weakness, cramps, atrophy.  Derm: Denies rash, itching, dry skin, hives, moles, warts, or unhealing ulcers.  Psych: Denies depression, anxiety, memory loss, suicidal ideation, hallucinations, paranoia, and confusion. Heme: Denies bruising, bleeding, and enlarged lymph nodes. Neuro:  Denies any headaches, dizziness, paresthesias. Endo:  Denies any problems with DM, thyroid, adrenal function.   Physical Exam: General:  Alert, well-developed, in NAD Head:  Normocephalic and atraumatic. Eyes:  Sclera clear, no icterus.   Conjunctiva pink. Ears:  Normal auditory acuity. Mouth:  No deformity or lesions.  Neck:  Supple; no masses . Lungs:  Clear throughout to auscultation.   No wheezes, crackles, or rhonchi. No acute distress. Heart:  Regular rate and rhythm; no murmurs. Abdomen:  Soft, nondistended, nontender. No masses, hepatomegaly. No obvious masses.  Normal bowel .    Rectal:  Deferred   Msk:  Symmetrical without gross deformities.. Pulses:  Normal pulses noted. Extremities:  Without edema. Neurologic:  Alert and  oriented x4;  grossly normal neurologically. Skin:  Intact without significant lesions or rashes. Cervical Nodes:  No significant cervical adenopathy. Psych:  Alert and cooperative. Normal mood and affect.   Impression / Plan:   CRC screening,  average risk, and GERD for colonoscopy and EGD.  Pricilla Riffle. Fuller Plan  06/21/2021, 2:10 PM See Shea Evans, [facility], to contact our on call provider

## 2021-06-21 NOTE — Progress Notes (Signed)
Report given to PACU, vss 

## 2021-06-21 NOTE — Patient Instructions (Signed)
Discharge instructions given. Handouts on polyps,diverticulosis,hemorrhoids and Hiatal Hernia. Resume previous medications. YOU HAD AN ENDOSCOPIC PROCEDURE TODAY AT [facility]:   Refer to the procedure report that was given to you for any specific questions about what was found during the examination.  If the procedure report does not answer your questions, please call your gastroenterologist to clarify.  If you requested that your care partner not be given the details of your procedure findings, then the procedure report has been included in a sealed envelope for you to review at your convenience later.  YOU SHOULD EXPECT: Some feelings of bloating in the abdomen. Passage of more gas than usual.  Walking can help get rid of the air that was put into your GI tract during the procedure and reduce the bloating. If you had a lower endoscopy (such as a colonoscopy or flexible sigmoidoscopy) you may notice spotting of blood in your stool or on the toilet paper. If you underwent a bowel prep for your procedure, you may not have a normal bowel movement for a few days.  Please Note:  You might notice some irritation and congestion in your nose or some drainage.  This is from the oxygen used during your procedure.  There is no need for concern and it should clear up in a day or so.  SYMPTOMS TO REPORT IMMEDIATELY:  Following lower endoscopy (colonoscopy or flexible sigmoidoscopy):  Excessive amounts of blood in the stool  Significant tenderness or worsening of abdominal pains  Swelling of the abdomen that is new, acute  Fever of 100F or higher  Following upper endoscopy (EGD)  Vomiting of blood or coffee ground material  New chest pain or pain under the shoulder blades  Painful or persistently difficult swallowing  New shortness of breath  Fever of 100F or higher  Black, tarry-looking stools  For urgent or emergent issues, a gastroenterologist can be reached at any hour by calling  [phone]. Do not use MyChart messaging for urgent concerns.    DIET:  We do recommend a small meal at first, but then you may proceed to your regular diet.  Drink plenty of fluids but you should avoid alcoholic beverages for 24 hours.  ACTIVITY:  You should plan to take it easy for the rest of today and you should NOT DRIVE or use heavy machinery until tomorrow (because of the sedation medicines used during the test).    FOLLOW UP: Our staff will call the number listed on your records 24-72 hours following your procedure to check on you and address any questions or concerns that you may have regarding the information given to you following your procedure. If we do not reach you, we will leave a message.  We will attempt to reach you two times.  During this call, we will ask if you have developed any symptoms of COVID 19. If you develop any symptoms (ie: fever, flu-like symptoms, shortness of breath, cough etc.) before then, please call [phone].  If you test positive for Covid 19 in the 2 weeks post procedure, please call and report this information to [place].    If any biopsies were taken you will be contacted by phone or by letter within the next 1-3 weeks.  Please call us at [phone] if you have not heard about the biopsies in 3 weeks.    SIGNATURES/CONFIDENTIALITY: You and/or your care partner have signed paperwork which will be entered into your electronic medical record.  These signatures  attest to the fact that that the information above on your After Visit Summary has been reviewed and is understood.  Full responsibility of the confidentiality of this discharge information lies with you and/or your care-partner.

## 2021-06-21 NOTE — Op Note (Signed)
[name]: Cheryl Fisher Procedure Date: 06/21/2021 2:16 PM MRN: [id number] Endoscopist: Ladene Artist , MD Age: 56 Referring MD:  Date of Birth: Jul 12, 1965 Gender: Female Account #: [id number] Procedure:                Colonoscopy Indications:              Screening for colorectal malignant neoplasm Medicines:                Monitored Anesthesia Care Procedure:                Pre-Anesthesia Assessment:                           - Prior to the procedure, a History and Physical                            was performed, and patient medications and                            allergies were reviewed. The patient's tolerance of                            previous anesthesia was also reviewed. The risks                            and benefits of the procedure and the sedation                            options and risks were discussed with the patient.                            All questions were answered, and informed consent                            was obtained. Prior Anticoagulants: The patient has                            taken no previous anticoagulant or antiplatelet                            agents. ASA Grade Assessment: II - A patient with                            mild systemic disease. After reviewing the risks                            and benefits, the patient was deemed in                            satisfactory condition to undergo the procedure.                           After obtaining informed consent, the colonoscope  was passed under direct vision. Throughout the                            procedure, the patient's blood pressure, pulse, and                            oxygen saturations were monitored continuously. The                            Olympus [id number]-[id number] (#[id number]) Colonoscope was                            introduced through the anus and advanced to the the                            cecum, identified by  appendiceal orifice and                            ileocecal valve. The ileocecal valve, appendiceal                            orifice, and rectum were photographed. The quality                            of the bowel preparation was good. The colonoscopy                            was performed without difficulty. The patient                            tolerated the procedure well. Scope In: 2:19:38 PM Scope Out: 2:37:52 PM Scope Withdrawal Time: 0 hours 13 minutes 28 seconds  Total Procedure Duration: 0 hours 18 minutes 14 seconds  Findings:                 The perianal and digital rectal examinations were                            normal.                           A 7 mm polyp was found in the ascending colon. The                            polyp was sessile. The polyp was removed with a                            cold snare. Resection and retrieval were complete.                           Scattered medium-mouthed diverticula were found in                            the right colon. There was no evidence of  diverticular bleeding.                           Multiple medium-mouthed diverticula were found in                            the left colon. There was evidence of diverticular                            spasm. There was no evidence of diverticular                            bleeding.                           Internal hemorrhoids were found during                            retroflexion. The hemorrhoids were small and Grade                            I (internal hemorrhoids that do not prolapse).                           The exam was otherwise without abnormality on                            direct and retroflexion views. Complications:            No immediate complications. Estimated blood loss:                            None. Estimated Blood Loss:     Estimated blood loss: none. Impression:               - One 7 mm polyp in the ascending colon,  removed                            with a cold snare. Resected and retrieved.                           - Mild diverticulosis in the right colon.                           - Moderate diverticulosis in the left colon.                           - Internal hemorrhoids.                           - The examination was otherwise normal on direct                            and retroflexion views. Recommendation:           - Repeat colonoscopy after studies are complete for  surveillance based on pathology results.                           - Patient has a contact number available for                            emergencies. The signs and symptoms of potential                            delayed complications were discussed with the                            patient. Return to normal activities tomorrow.                            Written discharge instructions were provided to the                            patient.                           - High fiber diet.                           - Continue present medications.                           - Await pathology results. Ladene Artist, MD 06/21/2021 2:50:50 PM This report has been signed electronically.

## 2021-06-21 NOTE — Op Note (Signed)
[facility] Patient Name: Cheryl Fisher Procedure Date: 06/21/2021 2:16 PM MRN: [id number] Endoscopist: Ladene Artist , MD Age: 56 Referring MD:  Date of Birth: March 25, 1965 Gender: Female Account #: [id number] Procedure:                Upper GI endoscopy Indications:              Gastroesophageal reflux disease Medicines:                Monitored Anesthesia Care Procedure:                Pre-Anesthesia Assessment:                           - Prior to the procedure, a History and Physical                            was performed, and patient medications and                            allergies were reviewed. The patient's tolerance of                            previous anesthesia was also reviewed. The risks                            and benefits of the procedure and the sedation                            options and risks were discussed with the patient.                            All questions were answered, and informed consent                            was obtained. Prior Anticoagulants: The patient has                            taken no previous anticoagulant or antiplatelet                            agents. ASA Grade Assessment: II - A patient with                            mild systemic disease. After reviewing the risks                            and benefits, the patient was deemed in                            satisfactory condition to undergo the procedure.                           After obtaining informed consent, the endoscope was  passed under direct vision. Throughout the                            procedure, the patient's blood pressure, pulse, and                            oxygen saturations were monitored continuously. The                            GIF [id number] #[id number] was introduced through the                            mouth, and advanced to the second part of duodenum.                            The upper GI endoscopy was  accomplished without                            difficulty. The patient tolerated the procedure                            well. Scope In: Scope Out: Findings:                 A single 3 mm mucosal nodule with a localized                            distribution was found in the proximal esophagus,                            21 cm from the incisors. Biopsies were taken with a                            cold forceps for histology.                           The exam of the esophagus was otherwise normal.                           Localized nodular mucosa, 8 mm, was found in the                            cardia immediately below the z-line. Biopsies were                            taken with a cold forceps for histology.                           A small hiatal hernia, Hill Grade III was present.                           The exam of the stomach was otherwise normal.  The duodenal bulb and second portion of the                            duodenum were normal. Complications:            No immediate complications. Estimated Blood Loss:     Estimated blood loss was minimal. Impression:               - Mucosal nodule found in the esophagus. Biopsied.                           - Nodular mucosa in the cardia. Biopsied.                           - Small hiatal hernia.                           - Normal duodenal bulb and second portion of the                            duodenum. Recommendation:           - Patient has a contact number available for                            emergencies. The signs and symptoms of potential                            delayed complications were discussed with the                            patient. Return to normal activities tomorrow.                            Written discharge instructions were provided to the                            patient.                           - Resume previous diet.                           - Follow  antireflux measures.                           - Continue present medications.                           - Await pathology results. Ladene Artist, MD 06/21/2021 2:57:35 PM This report has been signed electronically.

## 2021-06-21 NOTE — Progress Notes (Signed)
Called to room to assist during endoscopic procedure.  Patient ID and intended procedure confirmed with present staff. Received instructions for my participation in the procedure from the performing physician.  

## 2021-06-21 NOTE — Progress Notes (Signed)
Late entry:  1416 Robinul 0.1 mg IV given due large amount of secretions upon assessment.  MD made aware, vss

## 2021-06-22 ENCOUNTER — Telehealth: Payer: Self-pay

## 2021-06-22 NOTE — Telephone Encounter (Signed)
  Follow up Call-     06/21/2021    1:25 PM  Call back number  Post procedure Call Back phone  # [phone]  Permission to leave phone message Yes    Post op call attempted, no answer, left WM.

## 2021-06-22 NOTE — Telephone Encounter (Signed)
  Follow up Call-     06/21/2021    1:25 PM  Call back number  Post procedure Call Back phone  # [phone]  Permission to leave phone message Yes     Patient questions:  Do you have a fever, pain , or abdominal swelling? No. Pain Score  0 *  Have you tolerated food without any problems? Yes.    Have you been able to return to your normal activities? Yes.    Do you have any questions about your discharge instructions: Diet   No. Medications  No. Follow up visit  No.  Do you have questions or concerns about your Care? No.  Actions: * If pain score is 4 or above: No action needed, pain <4.

## 2021-07-03 ENCOUNTER — Encounter: Payer: Self-pay | Admitting: Gastroenterology

## 2021-10-05 ENCOUNTER — Encounter: Payer: BC Managed Care – PPO | Admitting: Radiology

## 2021-11-01 DIAGNOSIS — L820 Inflamed seborrheic keratosis: Secondary | ICD-10-CM | POA: Diagnosis not present

## 2021-11-01 DIAGNOSIS — L600 Ingrowing nail: Secondary | ICD-10-CM | POA: Diagnosis not present

## 2021-11-07 NOTE — Progress Notes (Signed)
Erroneous encounter

## 2021-11-30 DIAGNOSIS — Z23 Encounter for immunization: Secondary | ICD-10-CM | POA: Diagnosis not present

## 2021-12-14 ENCOUNTER — Encounter: Payer: BC Managed Care – PPO | Admitting: Radiology

## 2022-02-07 DIAGNOSIS — Z124 Encounter for screening for malignant neoplasm of cervix: Secondary | ICD-10-CM | POA: Diagnosis not present

## 2022-02-07 DIAGNOSIS — R6882 Decreased libido: Secondary | ICD-10-CM | POA: Diagnosis not present

## 2022-02-07 DIAGNOSIS — Z01419 Encounter for gynecological examination (general) (routine) without abnormal findings: Secondary | ICD-10-CM | POA: Diagnosis not present

## 2022-02-07 DIAGNOSIS — Z13820 Encounter for screening for osteoporosis: Secondary | ICD-10-CM | POA: Diagnosis not present

## 2022-02-07 DIAGNOSIS — Z6823 Body mass index (BMI) 23.0-23.9, adult: Secondary | ICD-10-CM | POA: Diagnosis not present

## 2022-02-07 DIAGNOSIS — Z1231 Encounter for screening mammogram for malignant neoplasm of breast: Secondary | ICD-10-CM | POA: Diagnosis not present

## 2022-02-28 DIAGNOSIS — R6882 Decreased libido: Secondary | ICD-10-CM | POA: Diagnosis not present

## 2022-03-28 DIAGNOSIS — R6882 Decreased libido: Secondary | ICD-10-CM | POA: Diagnosis not present

## 2022-05-17 DIAGNOSIS — R6882 Decreased libido: Secondary | ICD-10-CM | POA: Diagnosis not present

## 2022-05-17 DIAGNOSIS — Z23 Encounter for immunization: Secondary | ICD-10-CM | POA: Diagnosis not present

## 2022-08-30 DIAGNOSIS — Z1283 Encounter for screening for malignant neoplasm of skin: Secondary | ICD-10-CM | POA: Diagnosis not present

## 2022-08-30 DIAGNOSIS — D225 Melanocytic nevi of trunk: Secondary | ICD-10-CM | POA: Diagnosis not present

## 2022-08-30 DIAGNOSIS — Z08 Encounter for follow-up examination after completed treatment for malignant neoplasm: Secondary | ICD-10-CM | POA: Diagnosis not present

## 2022-08-30 DIAGNOSIS — Z85820 Personal history of malignant melanoma of skin: Secondary | ICD-10-CM | POA: Diagnosis not present

## 2022-12-06 DIAGNOSIS — K219 Gastro-esophageal reflux disease without esophagitis: Secondary | ICD-10-CM | POA: Diagnosis not present

## 2022-12-06 DIAGNOSIS — E7800 Pure hypercholesterolemia, unspecified: Secondary | ICD-10-CM | POA: Diagnosis not present

## 2022-12-06 DIAGNOSIS — E782 Mixed hyperlipidemia: Secondary | ICD-10-CM | POA: Diagnosis not present

## 2022-12-06 DIAGNOSIS — R7303 Prediabetes: Secondary | ICD-10-CM | POA: Diagnosis not present

## 2022-12-06 DIAGNOSIS — R030 Elevated blood-pressure reading, without diagnosis of hypertension: Secondary | ICD-10-CM | POA: Diagnosis not present

## 2022-12-06 DIAGNOSIS — B078 Other viral warts: Secondary | ICD-10-CM | POA: Diagnosis not present

## 2023-02-24 ENCOUNTER — Other Ambulatory Visit (HOSPITAL_BASED_OUTPATIENT_CLINIC_OR_DEPARTMENT_OTHER): Payer: Self-pay | Admitting: Obstetrics and Gynecology

## 2023-02-24 DIAGNOSIS — E785 Hyperlipidemia, unspecified: Secondary | ICD-10-CM

## 2023-03-14 ENCOUNTER — Ambulatory Visit (HOSPITAL_COMMUNITY): Payer: BC Managed Care – PPO

## 2023-03-21 ENCOUNTER — Ambulatory Visit (HOSPITAL_COMMUNITY)
Admission: RE | Admit: 2023-03-21 | Discharge: 2023-03-21 | Disposition: A | Discharge status: Home / Self Care | Payer: Self-pay | Source: Ambulatory Visit | Attending: Obstetrics and Gynecology | Admitting: Obstetrics and Gynecology

## 2023-03-21 DIAGNOSIS — E785 Hyperlipidemia, unspecified: Secondary | ICD-10-CM | POA: Insufficient documentation

## 2023-04-08 NOTE — Progress Notes (Unsigned)
 Cheryl Fisher [address] [place] [address] Phone: [phone] Subjective:   Cheryl Fisher, am serving as a scribe for Cheryl Fisher.  I'm seeing this patient by the request  of:  Cheryl Hazel, MD  CC: low back pain follow up   ION:[id number]  Cheryl Fisher is a 58 y.o. female coming in with complaint of LBP. Thinks she has popping hip on R side. Pain un R ankle below the malleolus. Activity and make it worse, but not getting worse. Ice and heat seem to help along with a naproxen if rally bad. May be some tingling in fingers and coldness in toes. Dental hygenist.       Past Medical History:  Diagnosis Date   Anxiety    Cardiac arrhythmia    Colon polyps 01/15/2007   Juvenille hamartomous, not adenomatous   Diverticular disease    GERD (gastroesophageal reflux disease)    Hematuria - cause not known    Hyperlipidemia    Malignant melanoma of skin of forearm, left (HCC)    excised.  Neg SLN, x 5   Past Surgical History:  Procedure Laterality Date   neg hx     Social History   Socioeconomic History   Marital status: Married    Spouse name: Not on file   Number of children: 1   Years of education: Not on file   Highest education level: Not on file  Occupational History   Occupation: dental hygentist    Employer: [employer]    Employer: [employer]  Tobacco Use   Smoking status: Former    Current packs/day: 0.00    Types: Cigarettes    Quit date: 01/14/1990    Years since quitting: 33.2   Smokeless tobacco: Never  Vaping Use   Vaping status: Never Used  Substance and Sexual Activity   Alcohol use: Yes    Comment: 1-2 per day   Drug use: No   Sexual activity: Not on file  Other Topics Concern   Not on file  Social History Narrative   1 adopted child from [place]   Social Drivers of Corporate investment banker Strain: Not on file  Food Insecurity: Not on file  Transportation Needs: Not on file  Physical  Activity: Not on file  Stress: Not on file  Social Connections: Not on file   Allergies  Allergen Reactions   Bacitracin     Other reaction(s): Unknown   Neomycin     Contact Dermititis   Retapamulin     Other reaction(s): Dizziness   Sulfa Antibiotics Hives   Tape     Contact Dermititis   Family History  Problem Relation Age of Onset   Diverticulitis Mother    Colon polyps Mother    Hypertension Mother    Hyperlipidemia Mother    Cancer Father        sarcoma, leg   Colon polyps Father    Hypertension Father    Hyperlipidemia Father    Diverticulitis Brother    Hypertension Brother    Diverticulitis Maternal Grandmother    Melanoma Paternal Grandfather    Colitis Other        pat cousin   Colon cancer Neg Hx    Esophageal cancer Neg Hx    Stomach cancer Neg Hx    Rectal cancer Neg Hx      Current Outpatient Medications (Cardiovascular):    atorvastatin (LIPITOR) 20 MG tablet,  Take 20 mg by mouth daily.   Current Outpatient Medications (Analgesics):    meloxicam (MOBIC) 15 MG tablet, Take 1 tablet (15 mg total) by mouth daily.   Current Outpatient Medications (Other):    ALPRAZolam (XANAX) 0.5 MG tablet, Take 0.5 mg by mouth as needed.    B Complex Vitamins (VITAMIN B COMPLEX) TABS, Take 1 tablet by mouth daily.   Cholecalciferol (VITAMIN D3 PO), Take 1 tablet by mouth daily.   clobetasol cream (TEMOVATE) 0.05 %, Apply 1 application. topically as needed. (Patient not taking: Reported on 06/21/2021)   escitalopram (LEXAPRO) 10 MG tablet, Take 10 mg by mouth daily.   famotidine (PEPCID) 20 MG tablet, Take 20 mg by mouth daily.   hyoscyamine (LEVSIN SL) 0.125 MG SL tablet, Take one tablet sublingual (under the tongue) every 6 to 8 hours as needed   Lactobacillus Rhamnosus, GG, (RA PROBIOTIC DIGESTIVE CARE) CAPS, Take 1 capsule by mouth daily.   Melatonin 5 MG CAPS, Take 1 tablet by mouth at bedtime.   Multiple Vitamin (MULTIVITAMIN) tablet, Take 1 tablet by mouth  daily.   OMEGA 3 1200 MG CAPS, Take by mouth.   Probiotic Product (PROBIOTIC PO), Take by mouth.   tretinoin (RETIN-A) 0.025 % cream, Apply 1 application. topically 2 (two) times a week.   Turmeric 500 MG CAPS, Take 1 tablet by mouth daily.   vitamin E 180 MG (400 UNITS) capsule, Take 1 capsule by mouth daily.   Reviewed prior external information including notes and imaging from  primary care provider As well as notes that were available from care everywhere and other healthcare systems.  Past medical history, social, surgical and family history all reviewed in electronic medical record.  No pertanent information unless stated regarding to the chief complaint.   Review of Systems:  No headache, visual changes, nausea, vomiting, diarrhea, constipation, dizziness, abdominal pain, skin rash, fevers, chills, night sweats, weight loss, swollen lymph nodes, body aches, joint swelling, chest pain, shortness of breath, mood changes. POSITIVE muscle aches  Objective  Blood pressure 124/82, pulse 64, height 5\' 5"  (1.651 m), weight 140 lb (63.5 kg), last menstrual period 06/28/2011, SpO2 99%.   General: No apparent distress alert and oriented x3 mood and affect normal, dressed appropriately.  HEENT: Pupils equal, extraocular movements intact  Respiratory: Patient's speak in full sentences and does not appear short of breath  Cardiovascular: No lower extremity edema, non tender, no erythema  Low back does have some loss lordosis noted.  Significant tightness noted in the hip.  Positive FABER test noted.  Patient does have tightness in the paraspinal musculature of the lumbar spine.   Osteopathic findings T9 extended rotated and side bent left L2 flexed rotated and side bent right L4 flexed rotated and side bent right. Sacrum right on right    Impression and Recommendations:    SI (sacroiliac) joint dysfunction Patient does have arthritic changes noted.  Discussed icing regimen and home  exercises, discussed avoiding certain activities.  Increase activity slowly.  Follow-up again in 6 to 8 weeks otherwise.  Due to some of the flares meloxicam 15 mg daily prescribed.  Warned of potential side effects.  Somatic dysfunction of spine, sacral   Decision today to treat with OMT was based on Physical Exam  After verbal consent patient was treated with HVLA, ME, FPR techniques in cervical, thoracic, rib, lumbar and sacral areas, all areas are chronic   Patient tolerated the procedure well with improvement in symptoms  Patient given  exercises, stretches and lifestyle modifications  See medications in patient instructions if given  Patient will follow up in 4-8 weeks  The above documentation has been reviewed and is accurate and complete Judi Saa, DO

## 2023-04-10 ENCOUNTER — Encounter: Payer: Self-pay | Admitting: Family Medicine

## 2023-04-10 ENCOUNTER — Ambulatory Visit (INDEPENDENT_AMBULATORY_CARE_PROVIDER_SITE_OTHER): Admitting: Family Medicine

## 2023-04-10 ENCOUNTER — Ambulatory Visit (INDEPENDENT_AMBULATORY_CARE_PROVIDER_SITE_OTHER)

## 2023-04-10 VITALS — BP 124/82 | HR 64 | Ht 65.0 in | Wt 140.0 lb

## 2023-04-10 DIAGNOSIS — M9901 Segmental and somatic dysfunction of cervical region: Secondary | ICD-10-CM

## 2023-04-10 DIAGNOSIS — G8929 Other chronic pain: Secondary | ICD-10-CM

## 2023-04-10 DIAGNOSIS — M9904 Segmental and somatic dysfunction of sacral region: Secondary | ICD-10-CM | POA: Insufficient documentation

## 2023-04-10 DIAGNOSIS — M9908 Segmental and somatic dysfunction of rib cage: Secondary | ICD-10-CM | POA: Diagnosis not present

## 2023-04-10 DIAGNOSIS — M545 Low back pain, unspecified: Secondary | ICD-10-CM | POA: Diagnosis not present

## 2023-04-10 DIAGNOSIS — M533 Sacrococcygeal disorders, not elsewhere classified: Secondary | ICD-10-CM | POA: Insufficient documentation

## 2023-04-10 DIAGNOSIS — M9902 Segmental and somatic dysfunction of thoracic region: Secondary | ICD-10-CM

## 2023-04-10 DIAGNOSIS — M9903 Segmental and somatic dysfunction of lumbar region: Secondary | ICD-10-CM

## 2023-04-10 MED ORDER — MELOXICAM 15 MG PO TABS: 15.0000 mg | ORAL_TABLET | Freq: Every day | ORAL | 0 refills | Status: AC

## 2023-04-10 NOTE — Assessment & Plan Note (Signed)
 Patient does have arthritic changes noted.  Discussed icing regimen and home exercises, discussed avoiding certain activities.  Increase activity slowly.  Follow-up again in 6 to 8 weeks otherwise.  Due to some of the flares meloxicam 15 mg daily prescribed.  Warned of potential side effects.

## 2023-04-10 NOTE — Patient Instructions (Addendum)
 good to see you! Tell Dorene Sorrow thanks Meloxicam 15mg  daily 10 days then as needed after in 3-5 day burst Do prescribed exercises at least 3x a week  Xray today See you again in 6-7 weeks

## 2023-04-10 NOTE — Assessment & Plan Note (Signed)

## 2023-04-16 ENCOUNTER — Encounter: Payer: Self-pay | Admitting: Family Medicine

## 2023-05-29 NOTE — Progress Notes (Unsigned)
 Cheryl Fisher [address] [place] [address] Phone: [phone] Subjective:   Cheryl Fisher, am serving as a scribe for Dr. Ronnell Fisher.  I'm seeing this patient by the request  of:  Cheryl Bradley, MD  CC: Back and neck pain follow-up  [id number]  Cheryl Fisher is a 58 y.o. female coming in with complaint of back and neck pain. OMT 04/10/2023. Patient states hips have been a bit bother some. Thinking bursitis. No new symptoms.  Medications patient has been prescribed: Meloxicam   Taking:         Reviewed prior external information including notes and imaging from previsou exam, outside providers and external EMR if available.   As well as notes that were available from care everywhere and other healthcare systems.  Past medical history, social, surgical and family history all reviewed in electronic medical record.  No pertanent information unless stated regarding to the chief complaint.   Past Medical History:  Diagnosis Date   Anxiety    Cardiac arrhythmia    Colon polyps 01/15/2007   Juvenille hamartomous, not adenomatous   Diverticular disease    GERD (gastroesophageal reflux disease)    Hematuria - cause not known    Hyperlipidemia    Malignant melanoma of skin of forearm, left (HCC)    excised.  Neg SLN, x 5    Allergies  Allergen Reactions   Bacitracin     Other reaction(s): Unknown   Neomycin     Contact Dermititis   Retapamulin     Other reaction(s): Dizziness   Sulfa Antibiotics Hives   Tape     Contact Dermititis     Review of Systems:  No headache, visual changes, nausea, vomiting, diarrhea, constipation, dizziness, abdominal pain, skin rash, fevers, chills, night sweats, weight loss, swollen lymph nodes, body aches, joint swelling, chest pain, shortness of breath, mood changes. POSITIVE muscle aches  Objective  Blood pressure 110/68, pulse 68, height 5\' 5"  (1.651 m), weight 140 lb (63.5 kg), last menstrual  period 06/28/2011, SpO2 98%.   General: No apparent distress alert and oriented x3 mood and affect normal, dressed appropriately.  HEENT: Pupils equal, extraocular movements intact  Respiratory: Patient's speak in full sentences and does not appear short of breath  Cardiovascular: No lower extremity edema, non tender, no erythema  Gait relatively normal MSK:  Back still has some loss lordosis noted.  Tightness noted with FABER left greater than right.  Positive FABER test noted.  Negative straight leg test noted.  Tender to palpation over the greater trochanteric area.  Osteopathic findings  C3 flexed rotated and side bent right C7 flexed rotated and side bent left T3 extended rotated and side bent right inhaled rib T9 extended rotated and side bent left L2 flexed rotated and side bent right L3 flexed rotated and side bent left Sacrum right on right       Assessment and Plan:  Greater trochanteric bursitis of left hip New problem, discussed icing regimen and home exercises, discussed which activities to do and which ones to avoid.  Increase activity slowly.  Discussed icing regimen.  Follow-up again in 6 to 8 weeks worsening pain will consider injection.    Nonallopathic problems  Decision today to treat with OMT was based on Physical Exam  After verbal consent patient was treated with HVLA, ME, FPR techniques in cervical, rib, thoracic, lumbar, and sacral  areas avoided HVLA on the cervical spine.  Responded well to osteopathic manipulation.  Patient tolerated the procedure well with improvement in symptoms  Patient given exercises, stretches and lifestyle modifications  See medications in patient instructions if given  Patient will follow up in 4-8 weeks     The above documentation has been reviewed and is accurate and complete Cheryl Crymes M [name], DO         Note: This dictation was prepared with Dragon dictation along with smaller phrase technology. Any  transcriptional errors that result from this process are unintentional.

## 2023-05-30 ENCOUNTER — Ambulatory Visit: Admitting: Family Medicine

## 2023-05-30 ENCOUNTER — Encounter: Payer: Self-pay | Admitting: Family Medicine

## 2023-05-30 VITALS — BP 110/68 | HR 68 | Ht 65.0 in | Wt 140.0 lb

## 2023-05-30 DIAGNOSIS — M7062 Trochanteric bursitis, left hip: Secondary | ICD-10-CM

## 2023-05-30 DIAGNOSIS — M9903 Segmental and somatic dysfunction of lumbar region: Secondary | ICD-10-CM

## 2023-05-30 DIAGNOSIS — M9908 Segmental and somatic dysfunction of rib cage: Secondary | ICD-10-CM | POA: Diagnosis not present

## 2023-05-30 DIAGNOSIS — M9901 Segmental and somatic dysfunction of cervical region: Secondary | ICD-10-CM

## 2023-05-30 DIAGNOSIS — M9904 Segmental and somatic dysfunction of sacral region: Secondary | ICD-10-CM | POA: Diagnosis not present

## 2023-05-30 DIAGNOSIS — M9902 Segmental and somatic dysfunction of thoracic region: Secondary | ICD-10-CM

## 2023-05-30 DIAGNOSIS — M533 Sacrococcygeal disorders, not elsewhere classified: Secondary | ICD-10-CM | POA: Diagnosis not present

## 2023-05-30 NOTE — Assessment & Plan Note (Addendum)
 New problem, discussed icing regimen and home exercises, discussed which activities to do and which ones to avoid.  Increase activity slowly.  Discussed icing regimen.  Follow-up again in 6 to 8 weeks worsening pain will consider injection.

## 2023-05-30 NOTE — Patient Instructions (Signed)
 Do prescribed exercises at least 3x a week Try to work on ergonomics when possible Ice hip before bed See you again in 6-8 weeks

## 2023-05-30 NOTE — Assessment & Plan Note (Signed)
 Chronic problem with tightness still noted.  Continue to work on Air cabin crew, do think that this could have been exacerbated by some of the exercises.  Will see how patient responds.  Follow-up again 6 to 8 weeks

## 2023-07-11 ENCOUNTER — Ambulatory Visit: Admitting: Family Medicine
